# Patient Record
Sex: Female | Born: 1955 | Race: Black or African American | Hispanic: No | Marital: Married | Smoking: Never smoker
Health system: Southern US, Community
[De-identification: ages and names within clinical notes are randomized; demographics above are authoritative.]

## PROBLEM LIST (undated history)

## (undated) DIAGNOSIS — R519 Headache, unspecified: Secondary | ICD-10-CM

## (undated) DIAGNOSIS — R51 Headache: Secondary | ICD-10-CM

## (undated) DIAGNOSIS — R413 Other amnesia: Secondary | ICD-10-CM

## (undated) DIAGNOSIS — K219 Gastro-esophageal reflux disease without esophagitis: Secondary | ICD-10-CM

## (undated) HISTORY — DX: Gastro-esophageal reflux disease without esophagitis: K21.9

## (undated) HISTORY — DX: Headache: R51

## (undated) HISTORY — PX: ABDOMINAL HYSTERECTOMY: SHX81

## (undated) HISTORY — DX: Other amnesia: R41.3

## (undated) HISTORY — DX: Headache, unspecified: R51.9

---

## 2012-08-10 ENCOUNTER — Ambulatory Visit: Payer: Self-pay | Admitting: Family Medicine

## 2012-10-20 ENCOUNTER — Ambulatory Visit: Payer: Self-pay | Admitting: Family Medicine

## 2012-12-18 ENCOUNTER — Other Ambulatory Visit: Payer: Self-pay | Admitting: *Deleted

## 2013-10-21 ENCOUNTER — Other Ambulatory Visit: Payer: Self-pay

## 2013-10-21 DIAGNOSIS — Z1231 Encounter for screening mammogram for malignant neoplasm of breast: Secondary | ICD-10-CM

## 2013-10-29 ENCOUNTER — Ambulatory Visit
Admission: RE | Admit: 2013-10-29 | Discharge: 2013-10-29 | Disposition: A | Discharge status: Home / Self Care | Payer: Self-pay | Source: Ambulatory Visit

## 2013-10-29 ENCOUNTER — Ambulatory Visit: Payer: Self-pay

## 2013-10-29 DIAGNOSIS — Z1231 Encounter for screening mammogram for malignant neoplasm of breast: Secondary | ICD-10-CM

## 2013-11-10 ENCOUNTER — Ambulatory Visit: Payer: Self-pay

## 2014-06-16 ENCOUNTER — Other Ambulatory Visit: Payer: Self-pay

## 2014-06-16 ENCOUNTER — Encounter: Payer: Self-pay | Admitting: Gastroenterology

## 2014-06-16 ENCOUNTER — Encounter (INDEPENDENT_AMBULATORY_CARE_PROVIDER_SITE_OTHER): Payer: Self-pay

## 2014-06-16 ENCOUNTER — Ambulatory Visit (INDEPENDENT_AMBULATORY_CARE_PROVIDER_SITE_OTHER): Payer: BC Managed Care – PPO | Admitting: Gastroenterology

## 2014-06-16 ENCOUNTER — Encounter (HOSPITAL_COMMUNITY): Payer: Self-pay | Admitting: Pharmacy Technician

## 2014-06-16 VITALS — BP 148/95 | HR 59 | Temp 97.8°F | Ht 59.0 in | Wt 153.2 lb

## 2014-06-16 DIAGNOSIS — K3189 Other diseases of stomach and duodenum: Secondary | ICD-10-CM

## 2014-06-16 DIAGNOSIS — R1310 Dysphagia, unspecified: Secondary | ICD-10-CM | POA: Insufficient documentation

## 2014-06-16 DIAGNOSIS — R5383 Other fatigue: Principal | ICD-10-CM

## 2014-06-16 DIAGNOSIS — R5381 Other malaise: Secondary | ICD-10-CM

## 2014-06-16 DIAGNOSIS — R1013 Epigastric pain: Secondary | ICD-10-CM | POA: Insufficient documentation

## 2014-06-16 MED ORDER — ESOMEPRAZOLE MAGNESIUM 40 MG PO CPDR: DELAYED_RELEASE_CAPSULE | ORAL | Status: DC

## 2014-06-16 NOTE — Addendum Note (Signed)
Addended by: Jennings Books on: 06/16/2014 10:46 AM   Modules accepted: Orders

## 2014-06-16 NOTE — Progress Notes (Signed)
   Subjective:    Patient ID: Misty Stanley, female    DOB: March 23, 1956, 58 y.o.   MRN: [id number]  Milinda Antis, MD  HPI Dx: REFLUX-OFF AND ON FOR PAST YEAR BUT OVER LAST 6-8 MOS AGO. GOT WORSE. FELT NAUSEATED, BUT NO VOMITING. WAS USING NEXIUM 1X/DAY. STILL TAKING IT FROM TIME TO TIME. WAS DRINKING 1-2 CUPS OF COFFEE/DAY(EEVRY DAY) AND NOW NONE. SX ARE BETTER.  NO CIGS OR ETOH. TRIGGERS: SALADS, EATING LATE. MAY HAVE PROBLEMS WITH BIG PILLS BUT NOT WITH FOOD. HAD TCS AGE 63 IN [place], [place]?). BMs: DAILY. COUPLE MOS BACK HAD SOB BUT NOT LATELY. RARE: IBUPROFEN. NO ASPIRIN, BC/GOODY POWDERS, OR NAPROXEN/ALEVE.  PT DENIES FEVER, CHILLS, HEMATOCHEZIA, HEMATEMESIS, vomiting, melena, diarrhea, CHEST PAIN, SHORTNESS OF BREATH,  CHANGE IN BOWEL IN HABITS, constipation, abdominal pain, PROBLEMS WITH SEDATIONOR problems swallowing.  Past Medical History  Diagnosis Date  . GERD (gastroesophageal reflux disease)     Past Surgical History  Procedure Laterality Date  . Abdominal hysterectomy      No Known Allergies  Current Outpatient Prescriptions  Medication Sig Dispense Refill  . estradiol (VIVELLE-DOT) 0.1 MG/24HR patch        Family History  Problem Relation Age of Onset  . Colon cancer Maternal Aunt   . Colon polyps Neg Hx     History  Substance Use Topics  . Smoking status: Never Smoker   . Smokeless tobacco: Not on file  . Alcohol Use: No   Review of Systems PER HPI OTHERWISE ALL SYSTEMS ARE NEGATIVE.     Objective:   Physical Exam  Vitals reviewed. Constitutional: She is oriented to person, place, and time. She appears well-developed and well-nourished. No distress.  HENT:  Head: Normocephalic and atraumatic.  Mouth/Throat: Oropharynx is clear and moist. No oropharyngeal exudate.  Eyes: Pupils are equal, round, and reactive to light. No scleral icterus.  Neck: Normal range of motion. Neck supple.  Cardiovascular: Normal rate, regular rhythm and normal  heart sounds.   Pulmonary/Chest: Effort normal and breath sounds normal. No respiratory distress.  Abdominal: Soft. Bowel sounds are normal. She exhibits no distension. There is tenderness. There is no rebound and no guarding.  MILD TTP IN THE EPIGASTRIUM   Musculoskeletal: She exhibits no edema.  Lymphadenopathy:    She has no cervical adenopathy.  Neurological: She is alert and oriented to person, place, and time.  NO FOCAL DEFICITS   Psychiatric: She has a normal mood and affect.          Assessment & Plan:

## 2014-06-16 NOTE — Progress Notes (Signed)
Referral has been made to [facility] in [place]

## 2014-06-16 NOTE — Assessment & Plan Note (Signed)
Most likely due to stricture.  Egd/dil OCT 1 OPV IN JAN 2016

## 2014-06-16 NOTE — Progress Notes (Signed)
cc'ed to pcp °

## 2014-06-16 NOTE — Assessment & Plan Note (Signed)
SX MOST LIKELY DUE TO UNCONTROLLED REFLUX,LESS LIKELY H PYLORI GASTRITIS OR PUD.  CONTINUE YOUR WEIGHT LOSS EFFORTS. FOLLOW A HIGH FIBER/LOW FAT DIET. SEE INFO BELOW. CONTINUE NEXIUM. TAKE 30 MINUTES BEFORE FIRST MEAL. AVOID TRIGGERS FOR REFLUX. HO GIVEN. CONTINUE YOUR EXERCISE PLAN: WALK 30 MINS THREE TIMES A WEEK. OPV JAN 2016

## 2014-06-16 NOTE — Patient Instructions (Signed)
CONTINUE YOUR WEIGHT LOSS EFFORTS. LOSE 10 LBS.  FOLLOW A HIGH FIBER/LOW FAT DIET. SEE INFO BELOW ON A LOW FAT DIET..  CONTINUE NEXIUM. TAKE 30 MINUTES BEFORE FIRST MEAL.  AVOID TRIGGERS FOR REFLUX. SEE INFO BELOW.  CONTINUE YOUR EXERCISE PLAN: WALK 30 MINS THREE TIMES A WEEK.  FOLLOW UP IN  JAN 2016.   Lifestyle and home remedies TO MANAGE REFLUX/CHEST PAIN  You may eliminate or reduce the frequency of heartburn by making the following lifestyle changes:    Control your weight. Being overweight is a major risk factor for heartburn and GERD. Excess pounds put pressure on your abdomen, pushing up your stomach and causing acid to back up into your esophagus.     Eat smaller meals. 4 TO 6 MEALS A DAY. This reduces pressure on the lower esophageal sphincter, helping to prevent the valve from opening and acid from washing back into your esophagus.     Loosen your belt. Clothes that fit tightly around your waist put pressure on your abdomen and the lower esophageal sphincter.     Eliminate heartburn triggers. Everyone has specific triggers. Common triggers such as fatty or fried foods, spicy food, tomato sauce, carbonated beverages, alcohol, chocolate, mint, garlic, onion, caffeine and nicotine may make heartburn worse.     Avoid stooping or bending. Tying your shoes is OK. Bending over for longer periods to weed your garden isn't, especially soon after eating.     Don't lie down after a meal. Wait at least three to four hours after eating before going to bed, and don't lie down right after eating.     PUT THE HEAD OF YOUR BED ON 6 INCH BLOCKS.   Alternative medicine   Several home remedies exist for treating GERD, but they provide only temporary relief. They include drinking baking soda (sodium bicarbonate) added to water or drinking other fluids such as baking soda mixed with cream of tartar and water.    Although these liquids create temporary relief by neutralizing, washing away or  buffering acids, eventually they aggravate the situation by adding gas and fluid to your stomach, increasing pressure and causing more acid reflux. Further, adding more sodium to your diet may increase your blood pressure and add stress to your heart, and excessive bicarbonate ingestion can alter the acid-base balance in your body.      Low-Fat Diet BREADS, CEREALS, PASTA, RICE, DRIED PEAS, AND BEANS These products are high in carbohydrates and most are low in fat. Therefore, they can be increased in the diet as substitutes for fatty foods. They too, however, contain calories and should not be eaten in excess. Cereals can be eaten for snacks as well as for breakfast.   FRUITS AND VEGETABLES It is good to eat fruits and vegetables. Besides being sources of fiber, both are rich in vitamins and some minerals. They help you get the daily allowances of these nutrients. Fruits and vegetables can be used for snacks and desserts.  MEATS Limit lean meat, chicken, [place], and fish to no more than 6 ounces per day. Beef, Pork, and Lamb Use lean cuts of beef, pork, and lamb. Lean cuts include:  Extra-lean ground beef.  Arm roast.  Sirloin tip.  Center-cut ham.  Round steak.  Loin chops.  Rump roast.  Tenderloin.  Trim all fat off the outside of meats before cooking. It is not necessary to severely decrease the intake of red meat, but lean choices should be made. Lean meat is rich in  protein and contains a highly absorbable form of iron. Premenopausal women, in particular, should avoid reducing lean red meat because this could increase the risk for low red blood cells (iron-deficiency anemia).  Chicken and [place] These are good sources of protein. The fat of poultry can be reduced by removing the skin and underlying fat layers before cooking. Chicken and [place] can be substituted for lean red meat in the diet. Poultry should not be fried or covered with high-fat sauces. Fish and Shellfish Fish is a  good source of protein. Shellfish contain cholesterol, but they usually are low in saturated fatty acids. The preparation of fish is important. Like chicken and [place], they should not be fried or covered with high-fat sauces. EGGS Egg whites contain no fat or cholesterol. They can be eaten often. Try 1 to 2 egg whites instead of whole eggs in recipes or use egg substitutes that do not contain yolk. MILK AND DAIRY PRODUCTS Use skim or 1% milk instead of 2% or whole milk. Decrease whole milk, natural, and processed cheeses. Use nonfat or low-fat (2%) cottage cheese or low-fat cheeses made from vegetable oils. Choose nonfat or low-fat (1 to 2%) yogurt. Experiment with evaporated skim milk in recipes that call for heavy cream. Substitute low-fat yogurt or low-fat cottage cheese for sour cream in dips and salad dressings. Have at least 2 servings of low-fat dairy products, such as 2 glasses of skim (or 1%) milk each day to help get your daily calcium intake. FATS AND OILS Reduce the total intake of fats, especially saturated fat. Butterfat, lard, and beef fats are high in saturated fat and cholesterol. These should be avoided as much as possible. Vegetable fats do not contain cholesterol, but certain vegetable fats, such as coconut oil, palm oil, and palm kernel oil are very high in saturated fats. These should be limited. These fats are often used in bakery goods, processed foods, popcorn, oils, and nondairy creamers. Vegetable shortenings and some peanut butters contain hydrogenated oils, which are also saturated fats. Read the labels on these foods and check for saturated vegetable oils. Unsaturated vegetable oils and fats do not raise blood cholesterol. However, they should be limited because they are fats and are high in calories. Total fat should still be limited to 30% of your daily caloric intake. Desirable liquid vegetable oils are corn oil, cottonseed oil, olive oil, canola oil, safflower oil, soybean  oil, and sunflower oil. Peanut oil is not as good, but small amounts are acceptable. Buy a heart-healthy tub margarine that has no partially hydrogenated oils in the ingredients. Mayonnaise and salad dressings often are made from unsaturated fats, but they should also be limited because of their high calorie and fat content. Seeds, nuts, peanut butter, olives, and avocados are high in fat, but the fat is mainly the unsaturated type. These foods should be limited mainly to avoid excess calories and fat. OTHER EATING TIPS Snacks  Most sweets should be limited as snacks. They tend to be rich in calories and fats, and their caloric content outweighs their nutritional value. Some good choices in snacks are graham crackers, melba toast, soda crackers, bagels (no egg), English muffins, fruits, and vegetables. These snacks are preferable to snack crackers, [place] fries, TORTILLA CHIPS, and POTATO chips. Popcorn should be air-popped or cooked in small amounts of liquid vegetable oil. Desserts Eat fruit, low-fat yogurt, and fruit ices instead of pastries, cake, and cookies. Sherbet, angel food cake, gelatin dessert, frozen low-fat yogurt, or other frozen  products that do not contain saturated fat (pure fruit juice bars, frozen ice pops) are also acceptable.  COOKING METHODS Choose those methods that use little or no fat. They include: Poaching.  Braising.  Steaming.  Grilling.  Baking.  Stir-frying.  Broiling.  Microwaving.  Foods can be cooked in a nonstick pan without added fat, or use a nonfat cooking spray in regular cookware. Limit fried foods and avoid frying in saturated fat. Add moisture to lean meats by using water, broth, cooking wines, and other nonfat or low-fat sauces along with the cooking methods mentioned above. Soups and stews should be chilled after cooking. The fat that forms on top after a few hours in the refrigerator should be skimmed off. When preparing meals, avoid using excess salt.  Salt can contribute to raising blood pressure in some people.  EATING AWAY FROM HOME Order entres, potatoes, and vegetables without sauces or butter. When meat exceeds the size of a deck of cards (3 to 4 ounces), the rest can be taken home for another meal. Choose vegetable or fruit salads and ask for low-calorie salad dressings to be served on the side. Use dressings sparingly. Limit high-fat toppings, such as bacon, crumbled eggs, cheese, sunflower seeds, and olives. Ask for heart-healthy tub margarine instead of butter.

## 2014-06-16 NOTE — Progress Notes (Signed)
PATIENT NIC'D  °

## 2014-06-23 ENCOUNTER — Encounter (HOSPITAL_COMMUNITY): Payer: Self-pay | Admitting: *Deleted

## 2014-06-23 ENCOUNTER — Encounter (HOSPITAL_COMMUNITY)
Admission: RE | Disposition: A | Discharge status: Home / Self Care | Payer: Self-pay | Source: Ambulatory Visit | Attending: Gastroenterology

## 2014-06-23 ENCOUNTER — Ambulatory Visit (HOSPITAL_COMMUNITY)
Admission: RE | Admit: 2014-06-23 | Discharge: 2014-06-23 | Disposition: A | Discharge status: Home / Self Care | Payer: BC Managed Care – PPO | Source: Ambulatory Visit | Attending: Gastroenterology | Admitting: Gastroenterology

## 2014-06-23 DIAGNOSIS — K222 Esophageal obstruction: Secondary | ICD-10-CM | POA: Insufficient documentation

## 2014-06-23 DIAGNOSIS — Z90710 Acquired absence of both cervix and uterus: Secondary | ICD-10-CM | POA: Diagnosis not present

## 2014-06-23 DIAGNOSIS — K219 Gastro-esophageal reflux disease without esophagitis: Secondary | ICD-10-CM | POA: Insufficient documentation

## 2014-06-23 DIAGNOSIS — Z79890 Hormone replacement therapy (postmenopausal): Secondary | ICD-10-CM | POA: Diagnosis not present

## 2014-06-23 DIAGNOSIS — Z79899 Other long term (current) drug therapy: Secondary | ICD-10-CM | POA: Insufficient documentation

## 2014-06-23 DIAGNOSIS — K2970 Gastritis, unspecified, without bleeding: Secondary | ICD-10-CM | POA: Insufficient documentation

## 2014-06-23 DIAGNOSIS — R1310 Dysphagia, unspecified: Secondary | ICD-10-CM | POA: Diagnosis present

## 2014-06-23 DIAGNOSIS — R1013 Epigastric pain: Secondary | ICD-10-CM

## 2014-06-23 HISTORY — PX: ESOPHAGOGASTRODUODENOSCOPY: SHX5428

## 2014-06-23 HISTORY — PX: SAVORY DILATION: SHX5439

## 2014-06-23 SURGERY — EGD (ESOPHAGOGASTRODUODENOSCOPY)
Anesthesia: Moderate Sedation

## 2014-06-23 MED ORDER — LIDOCAINE VISCOUS 2 % MT SOLN
OROMUCOSAL | Status: DC | PRN
  Administered 2014-06-23: 3 mL via OROMUCOSAL

## 2014-06-23 MED ORDER — MEPERIDINE HCL 100 MG/ML IJ SOLN
INTRAMUSCULAR | Status: AC
  Filled 2014-06-23: qty 2

## 2014-06-23 MED ORDER — STERILE WATER FOR IRRIGATION IR SOLN
Status: DC | PRN
  Administered 2014-06-23: 10:00:00

## 2014-06-23 MED ORDER — SODIUM CHLORIDE 0.9 % IV SOLN
INTRAVENOUS | Status: DC
  Administered 2014-06-23: 09:00:00 via INTRAVENOUS

## 2014-06-23 MED ORDER — MIDAZOLAM HCL 5 MG/5ML IJ SOLN
INTRAMUSCULAR | Status: DC | PRN
  Administered 2014-06-23: 2 mg via INTRAVENOUS
  Administered 2014-06-23: 1 mg via INTRAVENOUS
  Administered 2014-06-23: 2 mg via INTRAVENOUS

## 2014-06-23 MED ORDER — MEPERIDINE HCL 100 MG/ML IJ SOLN
INTRAMUSCULAR | Status: DC | PRN
  Administered 2014-06-23 (×3): 25 mg via INTRAVENOUS

## 2014-06-23 MED ORDER — MIDAZOLAM HCL 5 MG/5ML IJ SOLN
INTRAMUSCULAR | Status: DC
Start: 2014-06-23 — End: 2014-06-23
  Filled 2014-06-23: qty 10

## 2014-06-23 MED ORDER — LIDOCAINE VISCOUS 2 % MT SOLN
OROMUCOSAL | Status: AC
  Filled 2014-06-23: qty 15

## 2014-06-23 MED ORDER — MINERAL OIL PO OIL
TOPICAL_OIL | ORAL | Status: AC
  Filled 2014-06-23: qty 30

## 2014-06-23 NOTE — Interval H&P Note (Signed)
History and Physical Interval Note:  06/23/2014 10:10 AM  Misty Stanley  has presented today for surgery, with the diagnosis of DYSPHAGIA, DYSPEPSIA  The various methods of treatment have been discussed with the patient and family. After consideration of risks, benefits and other options for treatment, the patient has consented to  Procedure(s) with comments: ESOPHAGOGASTRODUODENOSCOPY (EGD) (N/A) - 10:15 MALONEY DILATION (N/A) SAVORY DILATION (N/A) as a surgical intervention .  The patient's history has been reviewed, patient examined, no change in status, stable for surgery.  I have reviewed the patient's chart and labs.  Questions were answered to the patient's satisfaction.     Eaton CorporationSandi Jillayne Witte

## 2014-06-23 NOTE — Op Note (Signed)
[facility] [address] [place], [id number]   ENDOSCOPY PROCEDURE REPORT  PATIENT: Misty Stanley, Misty Stanley  MR#: [id number] BIRTHDATE: 25-Jul-1956 , 58  yrs. old GENDER: female  ENDOSCOPIST: Jonette EvaSandi Fields, MD REFFERED XB:[id number]:Kawanta Traver, M.D.  PROCEDURE DATE:  06/23/2014 PROCEDURE:   EGD with biopsy and EGD with dilatation over guidewire   INDICATIONS:1.  dyspepsia.   2.  dysphagia. MEDICATIONS: Demerol 75 mg IV and Versed 5 mg IV TOPICAL ANESTHETIC: Viscous Xylocaine  DESCRIPTION OF PROCEDURE:   After the risks benefits and alternatives of the procedure were thoroughly explained, informed consent was obtained.  The [id number] ([id number])  endoscope was introduced through the mouth and advanced to the second portion of the duodenum. The instrument was slowly withdrawn as the mucosa was carefully examined.  Prior to withdrawal of the scope, the guidwire was placed.  The esophagus was dilated successfully.  The patient was recovered in endoscopy and discharged home in satisfactory condition.   ESOPHAGUS: A Schatzki ring was found at the gastroesophageal junction.   STOMACH: Mild non-erosive gastritis (inflammation) was found in the gastric antrum.  Multiple biopsies were performed using cold forceps.   Dilation was then performed at the gastroesphageal junction Dilator: Savary over guidewire Size(s): 14-16 MM Heme: none  COMPLICATIONS: There were no immediate complications.  ENDOSCOPIC IMPRESSION: 1.   Schatzki ring at the gastroesophageal junction 2.   MILD Non-erosive gastritis  RECOMMENDATIONS: CONTINUE YOUR WEIGHT LOSS EFFORTS.  LOSE 10 LBS. FOLLOW A HIGH FIBER/LOW FAT DIET.  SEE INFO BELOW ON A LOW FAT DIET. CONTINUE NEXIUM.  TAKE 30 MINUTES BEFORE FIRST MEAL. AVOID TRIGGERS FOR REFLUX. CONTINUE YOUR EXERCISE PLAN: WALK 30 MINS THREE TIMES A WEEK. FOLLOW UP IN JAN 2016.  _______________________________ eSignedJonette Eva:  Sandi Fields, MD 06/23/2014 10:54 AM   CPT  CODES: ICD CODES:  The ICD and CPT codes recommended by this software are interpretations from the data that the clinical staff has captured with the software.  The verification of the translation of this report to the ICD and CPT codes and modifiers is the sole responsibility of the health care institution and practicing physician where this report was generated.  [employer] will not be held responsible for the validity of the ICD and CPT codes included on this report.  AMA assumes no liability for data contained or not contained herein. CPT is a Publishing rights managerregistered trademark of the [employer].

## 2014-06-23 NOTE — H&P (View-Only) (Signed)
   Subjective:    Patient ID: Georgiann HahnVicky J Dilling, female    DOB: September 29, 1955, 58 y.o.   MRN: [id number]  Milinda AntisURHAM, KAWANTA, MD  HPI Dx: REFLUX-OFF AND ON FOR PAST YEAR BUT OVER LAST 6-8 MOS AGO. GOT WORSE. FELT NAUSEATED, BUT NO VOMITING. WAS USING NEXIUM 1X/DAY. STILL TAKING IT FROM TIME TO TIME. WAS DRINKING 1-2 CUPS OF COFFEE/DAY(EEVRY DAY) AND NOW NONE. SX ARE BETTER.  NO CIGS OR ETOH. TRIGGERS: SALADS, EATING LATE. MAY HAVE PROBLEMS WITH BIG PILLS BUT NOT WITH FOOD. HAD TCS AGE 70 IN [place], [place] BMs: DAILY. COUPLE MOS BACK HAD SOB BUT NOT LATELY. RARE: IBUPROFEN. NO ASPIRIN, BC/GOODY POWDERS, OR NAPROXEN/ALEVE.  PT DENIES FEVER, CHILLS, HEMATOCHEZIA, HEMATEMESIS, vomiting, melena, diarrhea, CHEST PAIN, SHORTNESS OF BREATH,  CHANGE IN BOWEL IN HABITS, constipation, abdominal pain, PROBLEMS WITH SEDATIONOR problems swallowing.  Past Medical History  Diagnosis Date  . GERD (gastroesophageal reflux disease)     Past Surgical History  Procedure Laterality Date  . Abdominal hysterectomy      No Known Allergies  Current Outpatient Prescriptions  Medication Sig Dispense Refill  . estradiol (VIVELLE-DOT) 0.1 MG/24HR patch        Family History  Problem Relation Age of Onset  . Colon cancer Maternal Aunt   . Colon polyps Neg Hx     History  Substance Use Topics  . Smoking status: Never Smoker   . Smokeless tobacco: Not on file  . Alcohol Use: No   Review of Systems PER HPI OTHERWISE ALL SYSTEMS ARE NEGATIVE.     Objective:   Physical Exam  Vitals reviewed. Constitutional: She is oriented to person, place, and time. She appears well-developed and well-nourished. No distress.  HENT:  Head: Normocephalic and atraumatic.  Mouth/Throat: Oropharynx is clear and moist. No oropharyngeal exudate.  Eyes: Pupils are equal, round, and reactive to light. No scleral icterus.  Neck: Normal range of motion. Neck supple.  Cardiovascular: Normal rate, regular rhythm and normal  heart sounds.   Pulmonary/Chest: Effort normal and breath sounds normal. No respiratory distress.  Abdominal: Soft. Bowel sounds are normal. She exhibits no distension. There is tenderness. There is no rebound and no guarding.  MILD TTP IN THE EPIGASTRIUM   Musculoskeletal: She exhibits no edema.  Lymphadenopathy:    She has no cervical adenopathy.  Neurological: She is alert and oriented to person, place, and time.  NO FOCAL DEFICITS   Psychiatric: She has a normal mood and affect.          Assessment & Plan:

## 2014-06-23 NOTE — Discharge Instructions (Signed)
I dilated your esophagus. You had a ring near the base of your esophagus. You have A HIATAL HERNIA AND gastritis. I biopsied your stomach.   CONTINUE YOUR WEIGHT LOSS EFFORTS. LOSE 10 LBS.   FOLLOW A HIGH FIBER/LOW FAT DIET.   CONTINUE NEXIUM. TAKE 30 MINUTES BEFORE FIRST MEAL.   AVOID TRIGGERS FOR REFLUX.    CONTINUE YOUR EXERCISE PLAN: WALK 30 MINS THREE TIMES A WEEK.   FOLLOW UP IN JAN 2016.  UPPER ENDOSCOPY AFTER CARE Read the instructions outlined below and refer to this sheet in the next week. These discharge instructions provide you with general information on caring for yourself after you leave the hospital. While your treatment has been planned according to the most current medical practices available, unavoidable complications occasionally occur. If you have any problems or questions after discharge, call DR. Secilia Apps, [phone].  ACTIVITY  You may resume your regular activity, but move at a slower pace for the next 24 hours.   Take frequent rest periods for the next 24 hours.   Walking will help get rid of the air and reduce the bloated feeling in your belly (abdomen).   No driving for 24 hours (because of the medicine (anesthesia) used during the test).   You may shower.   Do not sign any important legal documents or operate any machinery for 24 hours (because of the anesthesia used during the test).    NUTRITION  Drink plenty of fluids.   You may resume your normal diet as instructed by your doctor.   Begin with a light meal and progress to your normal diet. Heavy or fried foods are harder to digest and may make you feel sick to your stomach (nauseated).   Avoid alcoholic beverages for 24 hours or as instructed.    MEDICATIONS  You may resume your normal medications.   WHAT YOU CAN EXPECT TODAY  Some feelings of bloating in the abdomen.   Passage of more gas than usual.    IF YOU HAD A BIOPSY TAKEN DURING THE UPPER ENDOSCOPY:  Eat a soft diet  IF YOU HAVE NAUSEA, BLOATING, ABDOMINAL PAIN, OR VOMITING.    FINDING OUT THE RESULTS OF YOUR TEST Not all test results are available during your visit. DR. Darrick Penna WILL CALL YOU WITHIN 7 DAYS OF YOUR PROCEDUE WITH YOUR RESULTS. Do not assume everything is normal if you have not heard from DR. Geneveive Furness IN ONE WEEK, CALL HER OFFICE AT [phone].  SEEK IMMEDIATE MEDICAL ATTENTION AND CALL THE OFFICE: [phone] IF:  You have more than a spotting of blood in your stool.   Your belly is swollen (abdominal distention).   You are nauseated or vomiting.   You have a temperature over 101F.   You have abdominal pain or discomfort that is severe or gets worse throughout the day.  Gastritis  Gastritis is an inflammation (the body's way of reacting to injury and/or infection) of the stomach. It is often caused by viral or bacterial (germ) infections. It can also be caused BY ASPIRIN, BC/GOODY POWDER'S, (IBUPROFEN) MOTRIN, OR ALEVE (NAPROXEN), chemicals (including alcohol), SPICY FOODS, and medications. This illness may be associated with generalized malaise (feeling tired, not well), UPPER ABDOMINAL STOMACH cramps, and fever. One common bacterial cause of gastritis is an organism known as H. Pylori. This can be treated with antibiotics.   Hiatal Hernia A hiatal hernia occurs when a part of the stomach slides above the diaphragm. The diaphragm is the thin muscle separating the belly (  abdomen) from the chest. A hiatal hernia can be something you are born with or develop over time. Hiatal hernias may allow stomach acid to flow back into your esophagus, the tube which carries food from your mouth to your stomach. If this acid causes problems it is called GERD (gastro-esophageal reflux disease).   SYMPTOMS Common symptoms of GERD are heartburn (burning in your chest). This is worse when lying down or bending over. It may also cause belching and indigestion. Some of the things which make GERD worse  are:  Increased weight pushes on stomach making acid rise more easily.   Alcohol decreases lower esophageal sphincter pressure (valve between stomach and esophagus), allowing acid from stomach into esophagus.   Late evening meals and going to bed with a full stomach increases pressure.    HOME CARE INSTRUCTIONS  Try to achieve and maintain an ideal body weight.   Avoid drinking alcoholic beverages.   DO NOT smokE.   Do not wear tight clothing around your chest or stomach.   Eat smaller meals and eat more frequently. This keeps your stomach from getting too full. Eat slowly.   Do not lie down for 2 or 3 hours after eating. Do not eat or drink anything 1 to 2 hours before going to bed.   Avoid caffeine beverages (colas, coffee, cocoa, tea), fatty foods, citrus fruits and all other foods and drinks that contain acid and that seem to increase the problems.   Avoid bending over, especially after eating OR STRAINING. Anything that increases the pressure in your belly increases the amount of acid that may be pushed up into your esophagus.    ESOPHAGEAL RING Esophageal RING can be caused by stomach acid backing up into the tube that carries food from the mouth down to the stomach (lower esophagus).  TREATMENT There are a number of medicines used to treat reflux/stricture, including: Antacids.  NEXIUM  HOME CARE INSTRUCTIONS Eat 2-3 hours before going to bed.  Try to reach and maintain a healthy weight.  Do not eat just a few very large meals. Instead, eat 4 TO 6 smaller meals throughout the day.  Try to identify foods and beverages that make your symptoms worse, and avoid these.  Avoid tight clothing.  Do not exercise right after eating.

## 2014-06-27 ENCOUNTER — Encounter (HOSPITAL_COMMUNITY): Payer: Self-pay | Admitting: Gastroenterology

## 2014-07-18 ENCOUNTER — Telehealth: Payer: Self-pay | Admitting: Gastroenterology

## 2014-07-18 NOTE — Telephone Encounter (Signed)
Reminder in epic °

## 2014-07-18 NOTE — Telephone Encounter (Signed)
LMOM to call.

## 2014-07-18 NOTE — Telephone Encounter (Signed)
Please call pt. HER stomach Bx shows mild gastritis.   CONTINUE YOUR WEIGHT LOSS EFFORTS. LOSE 10 LBS.   FOLLOW A HIGH FIBER/LOW FAT DIET.  CONTINUE NEXIUM. TAKE 30 MINUTES BEFORE FIRST MEAL.   AVOID TRIGGERS FOR REFLUX.    CONTINUE YOUR EXERCISE PLAN: WALK 30 MINS THREE TIMES A WEEK.   FOLLOW UP IN JAN 2016.

## 2014-07-18 NOTE — Telephone Encounter (Signed)
Pt returned call and was informed.  

## 2014-09-19 ENCOUNTER — Encounter: Payer: Self-pay | Admitting: Gastroenterology

## 2014-11-16 ENCOUNTER — Ambulatory Visit: Payer: Self-pay | Admitting: Gastroenterology

## 2014-12-14 ENCOUNTER — Ambulatory Visit: Payer: Self-pay | Admitting: Gastroenterology

## 2015-01-12 ENCOUNTER — Ambulatory Visit (INDEPENDENT_AMBULATORY_CARE_PROVIDER_SITE_OTHER): Payer: BLUE CROSS/BLUE SHIELD | Admitting: Gastroenterology

## 2015-01-12 ENCOUNTER — Encounter: Payer: Self-pay | Admitting: Gastroenterology

## 2015-01-12 VITALS — BP 147/87 | HR 68 | Temp 97.6°F | Ht 60.0 in | Wt 160.0 lb

## 2015-01-12 DIAGNOSIS — R1013 Epigastric pain: Secondary | ICD-10-CM

## 2015-01-12 DIAGNOSIS — R1310 Dysphagia, unspecified: Secondary | ICD-10-CM | POA: Diagnosis not present

## 2015-01-12 NOTE — Patient Instructions (Signed)
CONTINUE YOUR WEIGHT LOSS EFFORTS.  FOLLOW A LOW FAT DIET. SEE INFO BELOW.  AVOID FOOD THAT TRIGGERS REFLUX. SEE INFO BELOW.   CONTINUE NEXIUM. TAKE 30 MINUTES BEFORE YOUR FIRST MEAL.  FOLLOW UP IN 3 MOS.   Low-Fat Diet BREADS, CEREALS, PASTA, RICE, DRIED PEAS, AND BEANS These products are high in carbohydrates and most are low in fat. Therefore, they can be increased in the diet as substitutes for fatty foods. They too, however, contain calories and should not be eaten in excess. Cereals can be eaten for snacks as well as for breakfast.   FRUITS AND VEGETABLES It is good to eat fruits and vegetables. Besides being sources of fiber, both are rich in vitamins and some minerals. They help you get the daily allowances of these nutrients. Fruits and vegetables can be used for snacks and desserts.  MEATS Limit lean meat, chicken, [place], and fish to no more than 6 ounces per day. Beef, Pork, and Lamb Use lean cuts of beef, pork, and lamb. Lean cuts include:  Extra-lean ground beef.  Arm roast.  Sirloin tip.  Center-cut ham.  Round steak.  Loin chops.  Rump roast.  Tenderloin.  Trim all fat off the outside of meats before cooking. It is not necessary to severely decrease the intake of red meat, but lean choices should be made. Lean meat is rich in protein and contains a highly absorbable form of iron. Premenopausal women, in particular, should avoid reducing lean red meat because this could increase the risk for low red blood cells (iron-deficiency anemia).  Chicken and [place] These are good sources of protein. The fat of poultry can be reduced by removing the skin and underlying fat layers before cooking. Chicken and [place] can be substituted for lean red meat in the diet. Poultry should not be fried or covered with high-fat sauces. Fish and Shellfish Fish is a good source of protein. Shellfish contain cholesterol, but they usually are low in saturated fatty acids. The preparation of  fish is important. Like chicken and [place], they should not be fried or covered with high-fat sauces. EGGS Egg whites contain no fat or cholesterol. They can be eaten often. Try 1 to 2 egg whites instead of whole eggs in recipes or use egg substitutes that do not contain yolk. MILK AND DAIRY PRODUCTS Use skim or 1% milk instead of 2% or whole milk. Decrease whole milk, natural, and processed cheeses. Use nonfat or low-fat (2%) cottage cheese or low-fat cheeses made from vegetable oils. Choose nonfat or low-fat (1 to 2%) yogurt. Experiment with evaporated skim milk in recipes that call for heavy cream. Substitute low-fat yogurt or low-fat cottage cheese for sour cream in dips and salad dressings. Have at least 2 servings of low-fat dairy products, such as 2 glasses of skim (or 1%) milk each day to help get your daily calcium intake. FATS AND OILS Reduce the total intake of fats, especially saturated fat. Butterfat, lard, and beef fats are high in saturated fat and cholesterol. These should be avoided as much as possible. Vegetable fats do not contain cholesterol, but certain vegetable fats, such as coconut oil, palm oil, and palm kernel oil are very high in saturated fats. These should be limited. These fats are often used in bakery goods, processed foods, popcorn, oils, and nondairy creamers. Vegetable shortenings and some peanut butters contain hydrogenated oils, which are also saturated fats. Read the labels on these foods and check for saturated vegetable oils. Unsaturated vegetable oils and  fats do not raise blood cholesterol. However, they should be limited because they are fats and are high in calories. Total fat should still be limited to 30% of your daily caloric intake. Desirable liquid vegetable oils are corn oil, cottonseed oil, olive oil, canola oil, safflower oil, soybean oil, and sunflower oil. Peanut oil is not as good, but small amounts are acceptable. Buy a heart-healthy tub margarine that  has no partially hydrogenated oils in the ingredients. Mayonnaise and salad dressings often are made from unsaturated fats, but they should also be limited because of their high calorie and fat content. Seeds, nuts, peanut butter, olives, and avocados are high in fat, but the fat is mainly the unsaturated type. These foods should be limited mainly to avoid excess calories and fat. OTHER EATING TIPS Snacks  Most sweets should be limited as snacks. They tend to be rich in calories and fats, and their caloric content outweighs their nutritional value. Some good choices in snacks are graham crackers, melba toast, soda crackers, bagels (no egg), English muffins, fruits, and vegetables. These snacks are preferable to snack crackers, [place] fries, TORTILLA CHIPS, and POTATO chips. Popcorn should be air-popped or cooked in small amounts of liquid vegetable oil. Desserts Eat fruit, low-fat yogurt, and fruit ices instead of pastries, cake, and cookies. Sherbet, angel food cake, gelatin dessert, frozen low-fat yogurt, or other frozen products that do not contain saturated fat (pure fruit juice bars, frozen ice pops) are also acceptable.  COOKING METHODS Choose those methods that use little or no fat. They include: Poaching.  Braising.  Steaming.  Grilling.  Baking.  Stir-frying.  Broiling.  Microwaving.  Foods can be cooked in a nonstick pan without added fat, or use a nonfat cooking spray in regular cookware. Limit fried foods and avoid frying in saturated fat. Add moisture to lean meats by using water, broth, cooking wines, and other nonfat or low-fat sauces along with the cooking methods mentioned above. Soups and stews should be chilled after cooking. The fat that forms on top after a few hours in the refrigerator should be skimmed off. When preparing meals, avoid using excess salt. Salt can contribute to raising blood pressure in some people.  EATING AWAY FROM HOME Order entres, potatoes, and  vegetables without sauces or butter. When meat exceeds the size of a deck of cards (3 to 4 ounces), the rest can be taken home for another meal. Choose vegetable or fruit salads and ask for low-calorie salad dressings to be served on the side. Use dressings sparingly. Limit high-fat toppings, such as bacon, crumbled eggs, cheese, sunflower seeds, and olives. Ask for heart-healthy tub margarine instead of butter.   Lifestyle and home remedies TO MANAGE REFLUX  You may eliminate or reduce the frequency of heartburn by making the following lifestyle changes:  . Control your weight. Being overweight is a major risk factor for heartburn and GERD. Excess pounds put pressure on your abdomen, pushing up your stomach and causing acid to back up into your esophagus.   . Eat smaller meals. 4 TO 6 MEALS A DAY. This reduces pressure on the lower esophageal sphincter, helping to prevent the valve from opening and acid from washing back into your esophagus.   Allena Earing. Loosen your belt. Clothes that fit tightly around your waist put pressure on your abdomen and the lower esophageal sphincter.   . Eliminate heartburn triggers. Everyone has specific triggers. Common triggers such as fatty or fried foods, spicy food, tomato sauce, carbonated beverages,  alcohol, chocolate, mint, garlic, onion, caffeine and nicotine may make heartburn worse.   Marland Kitchen Avoid stooping or bending. Tying your shoes is OK. Bending over for longer periods to weed your garden isn't, especially soon after eating.   . Don't lie down after a meal. Wait at least three to four hours after eating before going to bed, and don't lie down right after eating.   Marland Kitchen PUT THE HEAD OF YOUR BED ON 6 INCH BLOCKS.   Alternative medicine . Several home remedies exist for treating GERD, but they provide only temporary relief. They include drinking baking soda (sodium bicarbonate) added to water or drinking other fluids such as baking soda mixed with cream of tartar  and water.  . Although these liquids create temporary relief by neutralizing, washing away or buffering acids, eventually they aggravate the situation by adding gas and fluid to your stomach, increasing pressure and causing more acid reflux. Further, adding more sodium to your diet may increase your blood pressure and add stress to your heart, and excessive bicarbonate ingestion can alter the acid-base balance in your body.

## 2015-01-12 NOTE — Assessment & Plan Note (Signed)
SYMPTOMS improved but NOT IDEALLY CONTROLLED.  CONTINUE YOUR WEIGHT LOSS EFFORTS. FOLLOW A LOW FAT DIET. SEE INFO BELOW. CONTINUE NEXIUM. TAKE 30 MINUTES BEFORE YOUR FIRST MEAL. FOLLOW UP IN 3 MOS.

## 2015-01-12 NOTE — Progress Notes (Signed)
   Subjective:    Patient ID: Misty HahnVicky J Stanley, female    DOB: 1956-07-23, 59 y.o.   MRN: [id number]  Misty Stanley,Misty DAVID, MD  HPI HAVING TROUBLE WITH R LOW BACK AND LEG PAIN. HEARTBURN: NONE IF SHE TAKES HER PILLS AND EATS RIGHT. ADMITS SHE DOESN'T EAT RIGHT. BMS: EVERY 2-3 X/WEEK. MAY HAVE ABD PAIN OFF AND ON-W/ RAISIN BRAN. May eat chocolate, caffeine, drink a coke & WATER. GAINED 7 LBS SINCE OCT 2015.   PT DENIES FEVER, CHILLS, HEMATOCHEZIA, nausea, vomiting, melena, diarrhea, CHEST PAIN, SHORTNESS OF BREATH,  CHANGE IN BOWEL IN HABITS, constipation, problems swallowing, problems with sedation, heartburn or indigestion.   Past Medical History  Diagnosis Date  . GERD (gastroesophageal reflux disease)    Past Surgical History  Procedure Laterality Date  . Abdominal hysterectomy    . Esophagogastroduodenoscopy N/A 06/23/2014    SLF: 1. Schatzki ring at the gastroesophageal junction 2. MILD non-erosive gastritis.  Gaspar Bidding. Savory dilation N/A 06/23/2014    Procedure: SAVORY DILATION;  Surgeon: West BaliSandi L Ramaj Frangos, MD;  Location: [facility];  Service: Endoscopy;  Laterality: N/A;   No Known Allergies  Current Outpatient Prescriptions  Medication Sig Dispense Refill  . esomeprazole (NEXIUM) 40 MG capsule 1 PO 30 MINUTES PRIOR TO FIRST MEAL    . estradiol (VIVELLE-DOT) 0.1 MG/24HR patch Place 1 patch onto the skin 2 (two) times a week.      Review of Systems     Objective:   Physical Exam  Constitutional: She is oriented to person, place, and time. She appears well-developed and well-nourished. No distress.  HENT:  Head: Normocephalic and atraumatic.  Mouth/Throat: Oropharynx is clear and moist. No oropharyngeal exudate.  Eyes: Pupils are equal, round, and reactive to light. No scleral icterus.  Neck: Normal range of motion. Neck supple.  Cardiovascular: Normal rate, regular rhythm and normal heart sounds.   Pulmonary/Chest: Effort normal and breath sounds normal. No respiratory distress.   Abdominal: Soft. Bowel sounds are normal. She exhibits no distension. There is no tenderness.  Musculoskeletal: She exhibits no edema.  Lymphadenopathy:    She has no cervical adenopathy.  Neurological: She is alert and oriented to person, place, and time.  Psychiatric: She has a normal mood and affect.  Vitals reviewed.         Assessment & Plan:

## 2015-01-12 NOTE — Progress Notes (Signed)
cc'ed to pcp °

## 2015-01-12 NOTE — Assessment & Plan Note (Signed)
SYMPTOMS CONTROLLED/RESOLVED.  CONTINUE TO MONITOR SYMPTOMS. FOLLOW UP IN 3 MOS.   

## 2015-01-12 NOTE — Progress Notes (Signed)
ON RECALL LIST  °

## 2015-03-02 ENCOUNTER — Other Ambulatory Visit: Payer: Self-pay

## 2015-03-02 DIAGNOSIS — Z1231 Encounter for screening mammogram for malignant neoplasm of breast: Secondary | ICD-10-CM

## 2015-03-04 ENCOUNTER — Emergency Department (HOSPITAL_COMMUNITY)
Admission: EM | Admit: 2015-03-04 | Discharge: 2015-03-04 | Disposition: A | Discharge status: Home / Self Care | Payer: BLUE CROSS/BLUE SHIELD | Attending: Emergency Medicine | Admitting: Emergency Medicine

## 2015-03-04 ENCOUNTER — Emergency Department (HOSPITAL_COMMUNITY): Payer: BLUE CROSS/BLUE SHIELD

## 2015-03-04 ENCOUNTER — Encounter (HOSPITAL_COMMUNITY): Payer: Self-pay | Admitting: Emergency Medicine

## 2015-03-04 DIAGNOSIS — R202 Paresthesia of skin: Secondary | ICD-10-CM | POA: Insufficient documentation

## 2015-03-04 DIAGNOSIS — R51 Headache: Secondary | ICD-10-CM | POA: Insufficient documentation

## 2015-03-04 DIAGNOSIS — Z79899 Other long term (current) drug therapy: Secondary | ICD-10-CM | POA: Insufficient documentation

## 2015-03-04 DIAGNOSIS — R42 Dizziness and giddiness: Secondary | ICD-10-CM | POA: Diagnosis not present

## 2015-03-04 DIAGNOSIS — K219 Gastro-esophageal reflux disease without esophagitis: Secondary | ICD-10-CM | POA: Diagnosis not present

## 2015-03-04 DIAGNOSIS — R519 Headache, unspecified: Secondary | ICD-10-CM

## 2015-03-04 LAB — CBC WITH DIFFERENTIAL/PLATELET
Basophils Absolute: 0 10*3/uL (ref 0.0–0.1)
Basophils Relative: 1 % (ref 0–1)
Eosinophils Absolute: 0.1 10*3/uL (ref 0.0–0.7)
Eosinophils Relative: 1 % (ref 0–5)
HCT: 37.6 % (ref 36.0–46.0)
Hemoglobin: 12.8 g/dL (ref 12.0–15.0)
Lymphocytes Relative: 67 % — ABNORMAL HIGH (ref 12–46)
Lymphs Abs: 3.6 10*3/uL (ref 0.7–4.0)
MCH: 30 pg (ref 26.0–34.0)
MCHC: 34 g/dL (ref 30.0–36.0)
MCV: 88.3 fL (ref 78.0–100.0)
Monocytes Absolute: 0.4 10*3/uL (ref 0.1–1.0)
Monocytes Relative: 7 % (ref 3–12)
Neutro Abs: 1.3 10*3/uL — ABNORMAL LOW (ref 1.7–7.7)
Neutrophils Relative %: 24 % — ABNORMAL LOW (ref 43–77)
Platelets: 294 10*3/uL (ref 150–400)
RBC: 4.26 MIL/uL (ref 3.87–5.11)
RDW: 13 % (ref 11.5–15.5)
WBC: 5.3 10*3/uL (ref 4.0–10.5)

## 2015-03-04 LAB — COMPREHENSIVE METABOLIC PANEL
ALT: 14 U/L (ref 14–54)
AST: 22 U/L (ref 15–41)
Albumin: 4 g/dL (ref 3.5–5.0)
Alkaline Phosphatase: 78 U/L (ref 38–126)
Anion gap: 10 (ref 5–15)
BUN: 10 mg/dL (ref 6–20)
CO2: 24 mmol/L (ref 22–32)
Calcium: 9.1 mg/dL (ref 8.9–10.3)
Chloride: 103 mmol/L (ref 101–111)
Creatinine, Ser: 0.76 mg/dL (ref 0.44–1.00)
GFR calc Af Amer: >60 mL/min (ref >60)
GFR calc non Af Amer: >60 mL/min (ref >60)
Glucose, Bld: 124 mg/dL — ABNORMAL HIGH (ref 65–99)
Potassium: 3.4 mmol/L — ABNORMAL LOW (ref 3.5–5.1)
Sodium: 137 mmol/L (ref 135–145)
Total Bilirubin: 0.4 mg/dL (ref 0.3–1.2)
Total Protein: 8.2 g/dL — ABNORMAL HIGH (ref 6.5–8.1)

## 2015-03-04 LAB — CBG MONITORING, ED: Glucose-Capillary: 125 mg/dL — ABNORMAL HIGH (ref 65–99)

## 2015-03-04 MED ORDER — SODIUM CHLORIDE 0.9 % IV BOLUS (SEPSIS)
1000.0000 mL | Freq: Once | INTRAVENOUS | Status: AC
  Administered 2015-03-04: 1000 mL via INTRAVENOUS

## 2015-03-04 MED ORDER — MECLIZINE HCL 25 MG PO TABS
25.0000 mg | ORAL_TABLET | Freq: Once | ORAL | Status: AC
  Administered 2015-03-04: 25 mg via ORAL
  Filled 2015-03-04: qty 1

## 2015-03-04 MED ORDER — BUTALBITAL-APAP-CAFFEINE 50-325-40 MG PO TABS: 1.0000 | ORAL_TABLET | Freq: Four times a day (QID) | ORAL | Status: AC | PRN

## 2015-03-04 MED ORDER — MECLIZINE HCL 25 MG PO TABS: 25.0000 mg | ORAL_TABLET | Freq: Three times a day (TID) | ORAL | Status: AC | PRN

## 2015-03-04 MED ORDER — METOCLOPRAMIDE HCL 5 MG/ML IJ SOLN
10.0000 mg | INTRAMUSCULAR | Status: AC
  Administered 2015-03-04: 10 mg via INTRAVENOUS
  Filled 2015-03-04: qty 2

## 2015-03-04 MED ORDER — DIPHENHYDRAMINE HCL 50 MG/ML IJ SOLN
25.0000 mg | Freq: Once | INTRAMUSCULAR | Status: AC
  Administered 2015-03-04: 25 mg via INTRAVENOUS
  Filled 2015-03-04: qty 1

## 2015-03-04 NOTE — Discharge Instructions (Signed)
Take the prescribed medication as directed.  Also recommend to keep close monitor of your blood pressure. Follow-up with your primary care physician.  Also included contact information for neurology. Return to the ED for new or worsening symptoms.

## 2015-03-04 NOTE — ED Notes (Signed)
The patient said she has been having a headache for two weeks and her left and left are numb.  She went to her MD but they sent her here.  She rates her pain 10/10.  Her MD said she has stroke like symptoms and should come to the ED.  The patient drove herself here.

## 2015-03-04 NOTE — ED Notes (Signed)
Patient returned from CT

## 2015-03-04 NOTE — ED Notes (Signed)
Patient transported to CT 

## 2015-03-04 NOTE — ED Provider Notes (Signed)
CSN: [id number]     Arrival date & time 03/04/15  1550 History   First MD Initiated Contact with Patient 03/04/15 1650     Chief Complaint  Patient presents with  . Headache    The patient said she has been having a headache for two weeks and her left and left are numb.  She went to her MD but they sent her here.     (Consider location/radiation/quality/duration/timing/severity/associated sxs/prior Treatment) Patient is a 59 y.o. female presenting with headaches. The history is provided by the patient and medical records.  Headache Associated symptoms: dizziness     This is a 59 year old female with past medical history significant for acid reflux and vertigo, presenting to the ED or headache. Patient states she has been having a daily headache for the past 2 weeks.  She states headache is always present upon waking, seems to get better during the day, but becomes intense again at night time when she tries to go to bed.  She reports associated dizziness which she describes as "off balance".  She does have hx of vertigo but has not been on medications for this in quite some time.  She also reports some paresthesias of her left arm and leg, also worse at night time.  She denies focal numbness or weakness.  No ataxia, confusion, visual disturbance, changes in speech, back pain, neck pain, fever, chills, sweats.  No chest pain or SOB.  Patient states she went to her PCP today and was told to come here for further evaluation.  Patient has no hx of stroke.  She does admit her BP has been fluctuating recently.  Not currently on meds for HTN-- tries to control with diet and exercise.  Past Medical History  Diagnosis Date  . GERD (gastroesophageal reflux disease)    Past Surgical History  Procedure Laterality Date  . Abdominal hysterectomy    . Esophagogastroduodenoscopy N/A 06/23/2014    SLF: 1. Schatzki ring at the gastroesophageal junction 2. MILD non-erosive gastritis.  Gaspar Bidding dilation N/A  06/23/2014    Procedure: SAVORY DILATION;  Surgeon: West Bali, MD;  Location: [facility];  Service: Endoscopy;  Laterality: N/A;   Family History  Problem Relation Age of Onset  . Colon cancer Maternal Aunt   . Colon polyps Neg Hx    History  Substance Use Topics  . Smoking status: Never Smoker   . Smokeless tobacco: Not on file  . Alcohol Use: No   OB History    No data available     Review of Systems  Neurological: Positive for dizziness and headaches.  All other systems reviewed and are negative.     Allergies  Review of patient's allergies indicates no known allergies.  Home Medications   Prior to Admission medications   Medication Sig Start Date End Date Taking? Authorizing Provider  esomeprazole (NEXIUM) 20 MG capsule Take 20 mg by mouth daily at 12 noon.   Yes Historical Provider, MD  ibuprofen (ADVIL,MOTRIN) 200 MG tablet Take 600 mg by mouth daily as needed for headache (pain).   Yes Historical Provider, MD  esomeprazole (NEXIUM) 40 MG capsule 1 PO 30 MINUTES PRIOR TO FIRST MEAL Patient not taking: Reported on 03/04/2015 06/16/14   West Bali, MD   BP 170/96 mmHg  Pulse 72  Temp(Src) 98 F (36.7 C) (Oral)  Resp 16  Ht 4\' 11"  (1.499 m)  Wt 158 lb (71.668 kg)  BMI 31.89 kg/m2  SpO2 99%  Physical Exam  Constitutional: She is oriented to person, place, and time. She appears well-developed and well-nourished. No distress.  HENT:  Head: Normocephalic and atraumatic.  Mouth/Throat: Oropharynx is clear and moist.  Eyes: Conjunctivae and EOM are normal. Pupils are equal, round, and reactive to light.  Neck: Normal range of motion and full passive range of motion without pain. Neck supple. No rigidity.  No meningismus, no rigidity  Cardiovascular: Normal rate, regular rhythm and normal heart sounds.   Pulmonary/Chest: Effort normal and breath sounds normal. No respiratory distress. She has no wheezes.  Abdominal: Soft. Bowel sounds are normal. There  is no tenderness. There is no guarding.  Musculoskeletal: Normal range of motion. She exhibits no edema.  Neurological: She is alert and oriented to person, place, and time.  AAOx3, answering questions appropriately; equal strength UE and LE bilaterally; CN grossly intact; moves all extremities appropriately without ataxia; no focal neuro deficits or facial asymmetry appreciated  Skin: Skin is warm and dry. She is not diaphoretic.  Psychiatric: She has a normal mood and affect.  Nursing note and vitals reviewed.   ED Course  Procedures (including critical care time) Labs Review Labs Reviewed  CBC WITH DIFFERENTIAL/PLATELET - Abnormal; Notable for the following:    Neutrophils Relative % 24 (*)    Neutro Abs 1.3 (*)    Lymphocytes Relative 67 (*)    All other components within normal limits  COMPREHENSIVE METABOLIC PANEL - Abnormal; Notable for the following:    Potassium 3.4 (*)    Glucose, Bld 124 (*)    Total Protein 8.2 (*)    All other components within normal limits  CBG MONITORING, ED - Abnormal; Notable for the following:    Glucose-Capillary 125 (*)    All other components within normal limits    Imaging Review No results found.   EKG Interpretation None      MDM   Final diagnoses:  Headache   59 y.o. F with daily headache x2 weeks, waxing and waning throughout each day.  No hx of migraines.   Notes some paresthesias of left arm/leg that occur at night.  No focal weakness or numbness.  Patient is neurologically intact, without focal deficits here.  No RF for stroke.  Does have hx of vertigo as well.  No clinical signs of meningitis.  Lab work reassuring.  CT head also negative.  Patient was treated in ED with benadryl, reglan, IVF, and meclizine with resolution of symptoms, states she is feeling much better.  Neurologic exam remains non-focal.  Given negative work up here with resolution of symptoms, i have low suspicion for acute stroke and do not feel patient needs  further work-up.  Headache may be due to fluctuating BP vs cluster headaches.  Patient will be d/c home with fioricet and meclizine for symptomatic control.  She has FU with her PCP next week.  Also given referral to neurology.  Discussed plan with patient, he/she acknowledged understanding and agreed with plan of care.  Return precautions given for new or worsening symptoms.  Case discussed with attending physician, Dr. Juleen China, who agrees with assessment and plan of care.  Garlon Hatchet, PA-C 03/04/15 1909  Raeford Razor, MD 03/04/15 [phone]

## 2015-03-08 ENCOUNTER — Encounter: Payer: Self-pay | Admitting: Gastroenterology

## 2015-03-28 ENCOUNTER — Ambulatory Visit
Admission: RE | Admit: 2015-03-28 | Discharge: 2015-03-28 | Disposition: A | Discharge status: Home / Self Care | Payer: BLUE CROSS/BLUE SHIELD | Source: Ambulatory Visit

## 2015-03-28 DIAGNOSIS — Z1231 Encounter for screening mammogram for malignant neoplasm of breast: Secondary | ICD-10-CM

## 2015-03-30 ENCOUNTER — Ambulatory Visit (INDEPENDENT_AMBULATORY_CARE_PROVIDER_SITE_OTHER): Payer: BLUE CROSS/BLUE SHIELD | Admitting: Neurology

## 2015-03-30 ENCOUNTER — Encounter: Payer: Self-pay | Admitting: Neurology

## 2015-03-30 VITALS — BP 162/92 | HR 48 | Ht 59.0 in | Wt 161.6 lb

## 2015-03-30 DIAGNOSIS — M542 Cervicalgia: Secondary | ICD-10-CM

## 2015-03-30 DIAGNOSIS — M25519 Pain in unspecified shoulder: Secondary | ICD-10-CM | POA: Diagnosis not present

## 2015-03-30 DIAGNOSIS — M79605 Pain in left leg: Secondary | ICD-10-CM | POA: Diagnosis not present

## 2015-03-30 DIAGNOSIS — R200 Anesthesia of skin: Secondary | ICD-10-CM

## 2015-03-30 MED ORDER — PREGABALIN 50 MG PO CAPS: 50.0000 mg | ORAL_CAPSULE | Freq: Three times a day (TID) | ORAL | Status: DC

## 2015-03-30 NOTE — Progress Notes (Signed)
[facility] [address] [place]. [place] [address] [phone]       OFFICE CONSULT NOTE  Ms. NARELLE SCHOENING Date of Birth:  October 21, 1955 Medical Record Number:  [id number]   Referring MD:  J Hungarland Reason for Referral:  Neck and extremity pain and numbness.  HPI: 53 year pleasant African-American lady who's been having intermittent and progressive symptoms for the last 1 year. She states this initially began with arm pain after the death of her mother and subsequently started having intermittent transient pain involving the left, times left leg at times and posterior neck as well as headaches. The symptoms progressed over the last 6 months to become quite bothersome. She has also had some intermittent numbness in the left side. She denies any double vision, gait or balance problems. She denies any bladder or bowel urgency. She also complains of posterior neck and head pain which he describes as moderate L in nature which is now constant for the last several months. She was seen in the emergency room on 03/04/15 and was diagnosed with tension headache. CT scan of the head was obtained which are personally reviewed and was normal. She states that the pain is maximum in the back of the head but at times does radiate into her left shoulder. She also has similar pain in the left buttock which radiates down the lateral aspect of the left leg. She was seen by primary physician who started her on nortriptyline which she has taken for a couple of weeks but feels it does not help. She has tried Tylenol and ibuprofen which have not worked either. She was given Fioricet for headaches but she did not like the side effects and it did not help either. She denies any recent falls or head injury neck injury or known degenerative spine disease. She denies any history of rash, recurrent miscarriages no family members with similar illness. She has not tried gabapentin, Topamax and Lyrica. She has  not had any spine imaging studies done.  ROS:   14 system review of systems is positive for  headache, neck pain, arm pain, thigh pain, numbness, joint pain, aching muscles, fatigue, memory loss, headache, numbness, sleepiness, depression, not enough sleep, decreased energy and all other systems negative  PMH:  Past Medical History  Diagnosis Date  . GERD (gastroesophageal reflux disease)   . Headache   . Memory loss     Social History:  History   Social History  . Marital Status: Married    Spouse Name: N/A  . Number of Children: 1  . Years of Education: 12   Occupational History  . commisioner of revenue     Social History Main Topics  . Smoking status: Never Smoker   . Smokeless tobacco: Not on file  . Alcohol Use: No  . Drug Use: No  . Sexual Activity: Not on file   Other Topics Concern  . Not on file   Social History Narrative   Drinks 1 cup of coffee a day     Medications:   Current Outpatient Prescriptions on File Prior to Visit  Medication Sig Dispense Refill  . esomeprazole (NEXIUM) 20 MG capsule Take 20 mg by mouth daily at 12 noon.    . butalbital-acetaminophen-caffeine (FIORICET) 50-325-40 MG per tablet Take 1 tablet by mouth every 6 (six) hours as needed for headache. (Patient not taking: Reported on 03/30/2015) 20 tablet 0  . esomeprazole (NEXIUM) 40 MG capsule 1 PO 30 MINUTES PRIOR TO  FIRST MEAL (Patient not taking: Reported on 03/04/2015) 30 capsule 11  . ibuprofen (ADVIL,MOTRIN) 200 MG tablet Take 600 mg by mouth daily as needed for headache (pain).    . meclizine (ANTIVERT) 25 MG tablet Take 1 tablet (25 mg total) by mouth 3 (three) times daily as needed. (Patient not taking: Reported on 03/30/2015) 30 tablet 0   No current facility-administered medications on file prior to visit.    Allergies:  No Known Allergies  Physical Exam General: well developed, well nourished young African-American lady, seated, in no evident distress Head: head  normocephalic and atraumatic.   Neck: supple with no carotid or supraclavicular bruits. Mild spasm of posterior neck and interscapular muscles with slight restriction of movements. No trigger points noted Cardiovascular: regular rate and rhythm, no murmurs Musculoskeletal: no deformity Skin:  no rash/petichiae Vascular:  Normal pulses all extremities  Neurologic Exam Mental Status: Awake and fully alert. Oriented to place and time. Recent and remote memory intact. Attention span, concentration and fund of knowledge appropriate. Mood and affect appropriate.  Cranial Nerves: Fundoscopic exam reveals sharp disc margins. Pupils equal, briskly reactive to light. Extraocular movements full without nystagmus. Visual fields full to confrontation. Hearing intact. Facial sensation intact. Face, tongue, palate moves normally and symmetrically.  Motor: Normal bulk and tone. Normal strength in all tested extremity muscles. Sensory.: intact to touch , pinprick , position and vibratory sensation.  Coordination: Rapid alternating movements normal in all extremities. Finger-to-nose and heel-to-shin performed accurately bilaterally. Gait and Station: Arises from chair without difficulty. Stance is normal. Gait demonstrates normal stride length and balance . Able to heel, toe and tandem walk without difficulty.  Reflexes: 1+ and symmetric. Toes downgoing.    ASSESSMENT: 66  Year pleasant African-American lady with one year history of progressive posterior headache, neck pain, arm pain and paresthesias of undetermined etiology. Neurological exam is fairly unremarkable Possibilities include autoimmune, inflammatory or demyelinating conditions or fibromyalgia    PLAN: I had a long discussion with the patient with regards to her posterior head pain, neck pain and radicular left arm and leg pain discussed my differential diagnosis, plan for evaluation and answered questions. I recommend a trial of Lyrica 50 mg 3  times daily and if tolerated without side effect increase further. I discussed possible side effects with the patient and advised her to call me if needed. Check MRI scan of the brain and cervical spine as well as EMG nerve conduction studies for structural or compressive lesions and check ANA, ESR , B12, TSH,and Lyme titer. Return for follow-up in 2 months or call earlier if necessary.  Antony Contras, MD  Note: This document was prepared with digital dictation and possible smart phrase technology. Any transcriptional errors that result from this process are unintentional.

## 2015-03-30 NOTE — Patient Instructions (Signed)
I had a long discussion with the patient with regards to her posterior head pain, neck pain and radicular left arm and leg pain discussed my differential diagnosis, plan for evaluation and answered questions. I recommend a trial of Lyrica 50 mg 3 times daily and if tolerated without side effect increase further. I discussed possible side effects with the patient and advised her to call me if needed. Check MRI scan of the brain and cervical spine as well as EMG nerve conduction studies for structural or compressive lesions and check ANA, ESR , B12, TSH,and Lyme titer. Return for follow-up in 2 months or call earlier if necessary.

## 2015-03-31 LAB — ANA: ANA: NEGATIVE

## 2015-03-31 LAB — SEDIMENTATION RATE: Sed Rate: 10 mm/hr (ref 0–40)

## 2015-03-31 LAB — LYME AB/WESTERN BLOT REFLEX
LYME DISEASE AB, QUANT, IGM: <0.8 index (ref 0.00–0.79)
Lyme IgG/IgM Ab: <0.91 {ISR} (ref 0.00–0.90)

## 2015-03-31 LAB — TSH: TSH: 1.33 u[IU]/mL (ref 0.450–4.500)

## 2015-03-31 LAB — VITAMIN B12: Vitamin B-12: 455 pg/mL (ref 211–946)

## 2015-04-03 ENCOUNTER — Telehealth: Payer: Self-pay

## 2015-04-03 NOTE — Telephone Encounter (Signed)
LVM for patient to call office back to receive labs results, including B12, ANA, ESR and Lyme antibody tests were all normal.

## 2015-04-04 NOTE — Telephone Encounter (Signed)
I called the patient and informed her that her labs were normal.

## 2015-04-04 NOTE — Telephone Encounter (Signed)
Pt  Called back about her lab results. Please call [phone], or [phone] Cell , states that you may leave a message about her results on the phone.

## 2015-04-05 ENCOUNTER — Other Ambulatory Visit: Payer: Self-pay

## 2015-04-05 ENCOUNTER — Ambulatory Visit (INDEPENDENT_AMBULATORY_CARE_PROVIDER_SITE_OTHER): Payer: BLUE CROSS/BLUE SHIELD

## 2015-04-05 ENCOUNTER — Telehealth: Payer: Self-pay

## 2015-04-05 DIAGNOSIS — M542 Cervicalgia: Secondary | ICD-10-CM | POA: Diagnosis not present

## 2015-04-05 DIAGNOSIS — M79605 Pain in left leg: Secondary | ICD-10-CM

## 2015-04-05 DIAGNOSIS — M25519 Pain in unspecified shoulder: Secondary | ICD-10-CM | POA: Diagnosis not present

## 2015-04-05 DIAGNOSIS — R200 Anesthesia of skin: Secondary | ICD-10-CM

## 2015-04-05 MED ORDER — TOPIRAMATE 25 MG PO TABS: 25.0000 mg | ORAL_TABLET | Freq: Two times a day (BID) | ORAL | Status: DC

## 2015-04-05 NOTE — Telephone Encounter (Signed)
Misty Stanley states that she is unable to tolerate the Lyrica. Reports that she is unable to function at work and causes stomach discomfort. She has tried just taking it at night, but still feels "groggy" the next day. [name] asked if she can try prescription Ibuprofen 800mg . States that she has been on it in the past and reported relief of headaches.

## 2015-04-05 NOTE — Telephone Encounter (Signed)
Med list updated.  Rx has been sent.  Patient is aware.

## 2015-04-05 NOTE — Telephone Encounter (Signed)
Misty Stanley had an MRI and stopped by to see what could be done.Marland Kitchen.Marland Kitchen.She stated Dr Pearlean BrownieSethi.Marland Kitchen.Marland Kitchen.Gave her an RX that is not working and she was wondering what else there was that can be given...please advise

## 2015-04-05 NOTE — Telephone Encounter (Signed)
Stop lyrica and try topamax 25 mg twice daily instead. Ibuprofen 800 mg is likely to be harmful if taken for a long period of time

## 2015-04-05 NOTE — Telephone Encounter (Signed)
I called back and spoke with the patient.  Relayed providers message.  Patient expressed understanding and is agreeable to this plan.  She will call us back if anything further is needed.

## 2015-04-05 NOTE — Telephone Encounter (Signed)
Micki RileyPramod S Sethi, MD at 04/05/2015 3:34 PM     Status: Signed       Expand All Collapse All   Stop lyrica and try topamax 25 mg twice daily instead. Ibuprofen 800 mg is likely to be harmful if taken for a long period of time

## 2015-04-06 MED ORDER — GADOPENTETATE DIMEGLUMINE 469.01 MG/ML IV SOLN: 17.0000 mL | Freq: Once | INTRAVENOUS | Status: AC | PRN

## 2015-04-10 ENCOUNTER — Telehealth: Payer: Self-pay

## 2015-04-10 NOTE — Telephone Encounter (Signed)
Calling to inform patient that MRI scan of the brain is fine and shows only minor age-related changes of wear and tear,  MRI scan of the neck also shows mild wear and tear findings but without significant pinched nerve or spinal stenosis. She has been asked to call office back to receive results.

## 2015-04-11 NOTE — Telephone Encounter (Signed)
Patient called/returning Misty Stanley's call in regards to MRI results. Please call and advise. Patient can be reached @ [phone]

## 2015-04-11 NOTE — Telephone Encounter (Addendum)
Patient returned call, please call and advise. [phone])

## 2015-04-12 NOTE — Telephone Encounter (Signed)
error 

## 2015-04-12 NOTE — Telephone Encounter (Signed)
Spoke with patient to give her MRI results, she verbalized understanding.  Patient also requested appt with Dr. Pearlean BrownieSethi be switched to Dr. Lucia GaskinsAhern; patients feels more comfortable with female provider.

## 2015-04-17 ENCOUNTER — Ambulatory Visit (INDEPENDENT_AMBULATORY_CARE_PROVIDER_SITE_OTHER): Payer: BLUE CROSS/BLUE SHIELD | Admitting: Neurology

## 2015-04-17 ENCOUNTER — Ambulatory Visit (INDEPENDENT_AMBULATORY_CARE_PROVIDER_SITE_OTHER): Payer: Self-pay | Admitting: Neurology

## 2015-04-17 DIAGNOSIS — M25562 Pain in left knee: Secondary | ICD-10-CM

## 2015-04-17 DIAGNOSIS — M25519 Pain in unspecified shoulder: Secondary | ICD-10-CM

## 2015-04-17 DIAGNOSIS — M79605 Pain in left leg: Secondary | ICD-10-CM

## 2015-04-17 DIAGNOSIS — M79622 Pain in left upper arm: Secondary | ICD-10-CM

## 2015-04-17 DIAGNOSIS — M25522 Pain in left elbow: Secondary | ICD-10-CM

## 2015-04-17 DIAGNOSIS — M542 Cervicalgia: Secondary | ICD-10-CM

## 2015-04-17 DIAGNOSIS — R200 Anesthesia of skin: Secondary | ICD-10-CM

## 2015-04-17 NOTE — Procedures (Signed)
     HISTORY:  Misty Stanley is a 59 year old patient with a one-year history of sharp pains involving the left arm and left leg associated with some sensation of weakness. She describes some neck discomfort. She is being evaluated for a possible neuropathy or a radiculopathy.  NERVE CONDUCTION STUDIES:  Nerve conduction studies were performed on both upper extremities. The distal motor latencies and motor amplitudes for the median and ulnar nerves were within normal limits. The F wave latencies and nerve conduction velocities for these nerves were also normal. The sensory latencies for the median and ulnar nerves were normal.  Nerve conduction studies were performed on the left lower extremity. The distal motor latencies and motor amplitudes for the peroneal and posterior tibial nerves were within normal limits. The nerve conduction velocities for these nerves were also normal. The H reflex latency was normal. The sensory latency for the peroneal nerve was within normal limits.   EMG STUDIES:  EMG study was performed on the left upper extremity:  The first dorsal interosseous muscle reveals 2 to 4 K units with full recruitment. No fibrillations or positive waves were noted. The abductor pollicis brevis muscle reveals 2 to 4 K units with full recruitment. No fibrillations or positive waves were noted. The extensor indicis proprius muscle reveals 1 to 3 K units with full recruitment. No fibrillations or positive waves were noted. The pronator teres muscle reveals 2 to 3 K units with full recruitment. No fibrillations or positive waves were noted. The biceps muscle reveals 1 to 2 K units with full recruitment. No fibrillations or positive waves were noted. The triceps muscle reveals 2 to 4 K units with full recruitment. No fibrillations or positive waves were noted. The anterior deltoid muscle reveals 2 to 3 K units with full recruitment. No fibrillations or positive waves were noted. The  cervical paraspinal muscles were tested at 2 levels. No abnormalities of insertional activity were seen at either level tested. There was good relaxation.   EMG study was performed on the left lower extremity:  The tibialis anterior muscle reveals 2 to 4K motor units with full recruitment. No fibrillations or positive waves were seen. The peroneus tertius muscle reveals 2 to 4K motor units with full recruitment. No fibrillations or positive waves were seen. The medial gastrocnemius muscle reveals 1 to 3K motor units with full recruitment. No fibrillations or positive waves were seen. The vastus lateralis muscle reveals 2 to 4K motor units with full recruitment. No fibrillations or positive waves were seen. The iliopsoas muscle reveals 2 to 4K motor units with full recruitment. No fibrillations or positive waves were seen. The biceps femoris muscle (long head) reveals 2 to 4K motor units with full recruitment. No fibrillations or positive waves were seen. The lumbosacral paraspinal muscles were tested at 3 levels, and revealed no abnormalities of insertional activity at all 3 levels tested. There was good relaxation.   IMPRESSION:  Nerve conduction studies done on both upper extremities and on the left lower extremity were unremarkable, no evidence of a neuropathy is seen. EMG evaluation on the left upper and left lower extremities were unremarkable, without evidence of an overlying cervical or lumbar radiculopathy.  Marlan Palau MD 04/17/2015 1:59 PM  [facility] [address] [place], [place] [address]  Phone [phone] Fax [phone]

## 2015-04-17 NOTE — Progress Notes (Signed)
Please refer to EMG and NCV procedure note. 

## 2015-04-18 ENCOUNTER — Telehealth: Payer: Self-pay

## 2015-04-18 NOTE — Telephone Encounter (Signed)
Informed patient that nerve conduction EMG studies are normal.  Patient verbalized understanding

## 2015-04-26 ENCOUNTER — Telehealth: Payer: Self-pay

## 2015-04-26 NOTE — Telephone Encounter (Signed)
LVM for pt, OK to inform her that her EMG results were normal.

## 2015-05-19 ENCOUNTER — Telehealth: Payer: Self-pay | Admitting: Neurology

## 2015-05-19 NOTE — Telephone Encounter (Signed)
Left message for patient to come early to the office at 12:45 instead on Monday. She was scheduled in a 15 minute appointment due to rescheduling issues which I approved. But since I have never sene ehr before, I need 30 minutes. If eh gets here at 12:45  Can see her from 1-1:30.   Misty Stanley - would you let the front desk know I will be seeing her at 1pm instead? Maybe we can even change that on the schedule. Thanks

## 2015-05-22 ENCOUNTER — Ambulatory Visit (INDEPENDENT_AMBULATORY_CARE_PROVIDER_SITE_OTHER): Payer: BLUE CROSS/BLUE SHIELD | Admitting: Neurology

## 2015-05-22 ENCOUNTER — Encounter: Payer: Self-pay | Admitting: Neurology

## 2015-05-22 VITALS — BP 141/89 | HR 64 | Ht 59.0 in | Wt 160.0 lb

## 2015-05-22 DIAGNOSIS — M79602 Pain in left arm: Secondary | ICD-10-CM

## 2015-05-22 DIAGNOSIS — R519 Headache, unspecified: Secondary | ICD-10-CM | POA: Insufficient documentation

## 2015-05-22 DIAGNOSIS — R51 Headache: Secondary | ICD-10-CM

## 2015-05-22 DIAGNOSIS — G4719 Other hypersomnia: Secondary | ICD-10-CM | POA: Diagnosis not present

## 2015-05-22 DIAGNOSIS — R0683 Snoring: Secondary | ICD-10-CM

## 2015-05-22 DIAGNOSIS — M791 Myalgia: Secondary | ICD-10-CM | POA: Diagnosis not present

## 2015-05-22 DIAGNOSIS — M5416 Radiculopathy, lumbar region: Secondary | ICD-10-CM | POA: Diagnosis not present

## 2015-05-22 DIAGNOSIS — M7918 Myalgia, other site: Secondary | ICD-10-CM

## 2015-05-22 MED ORDER — NEBIVOLOL HCL 5 MG PO TABS: 5.0000 mg | ORAL_TABLET | Freq: Every day | ORAL | Status: DC

## 2015-05-22 MED ORDER — CYCLOBENZAPRINE HCL 5 MG PO TABS: 5.0000 mg | ORAL_TABLET | Freq: Every day | ORAL | Status: DC

## 2015-05-22 NOTE — Telephone Encounter (Signed)
Dr. Lucia Gaskins-- I put a note in the notes section so the front staff will know its okay if she checks in early, thank you!

## 2015-05-22 NOTE — Progress Notes (Signed)
[facility]    Provider:  Dr Jaynee Eagles Referring Provider: Yvone Stanley,* Primary Care Physician:  Misty Neu, MD   HPI:  Misty Stanley is a 59 y.o. female here as a referral from [name] for headache and left-sided muscular pain. She is transitioning from Dr. Leonie Stanley to me for headaches. She has neck pain. She wakes up at 2am with pounding in the head, headaches. Throbbing. She also wakes up with neck pain. Husband says she snores excessively, it is so loud she vibrates the bed. Excessively tired during the day. Everything on her body hurts. Pain is muscular, tight like it needs to be worked out in the left neck and low back. No weakness but there is pain. The left leg is tight as well. She sleeps on that side. She has shooting pain, sharp and achy all the way down to the foot. The back is chronic, but no weakness just a lot of pain.   Headaches started 6-7 months ago. Left-sided muscular pain was over a year ago in onset. Headaches are daily and in the morning. The headaches are not during the day, only nocturnally and in the morning. Left-sided pain is getting worse. Frequent awakenings and severe snoring with apneic events. Left-sided muscular pain is worse apon waking. No light sensitivity, sound sensitivity.   Reviewed notes, labs and imaging from outside physicians, which showed:   Nerve conduction studies done on both upper extremities and on the left lower extremity were unremarkable, no evidence of a neuropathy is seen. EMG evaluation on the left upper and left lower extremities were unremarkable, without evidence of an overlying cervical or lumbar radiculopathy. Personally reviewed data and images.  MRI cervical spine:personally reviewed.   FINDINGS: : On sagittal images, the spine is imaged from above the cervicomedullary junction to T2T3. Paravertebral soft tissue appears normal. The spinal cord is of normal caliber and signal. The  vertebral bodies are normally aligned. The vertebral bodies have normal signal. The discs and interspaces were further evaluated on axial views from C2 to T1 as follows: C2 - C3: The disc and interspace appear normal. C3 - C4: There is mild left greater than right uncovertebral spurring with minimal disc bulging. There is mild bilateral foraminal narrowing but no nerve root compression. . C4 - C5: There is minimal disc bulging. There is no significant foraminal narrowing and no nerve root impingement. C5 - C6: There is mild bilateral uncovertebral spurring and minimal disc bulging. The neural foramina are mildly narrowed but there does not appear to be any nerve root impingement.. C6 - C7: There is mild bilateral uncovertebral spurring and 4 disc protrusion. The neural foramina are mildly narrowed but there is no nerve root impingement.. C7 - T1: The disc and interspace appear normal.   IMPRESSION: This is an abnormal MRI of the cervical spine showing mild multilevel degenerative changes as detailed above. There does not appear to be any nerve root impingement at any of the cervical levels.  MRi of the brain: personally reviewd images IMPRESSION: This is a normal age appropriate MRI of the head with and without contrast. There are a few punctate T2/FLAIR hyperintense foci in the subcortical white matter of the left frontal lobe likely due to minimal age-appropriate small vessel ischemic change. There are no acute findings  Unremarkable lans: ANA, esr, lyme, b12. Tsh, cbc, cmp  Previous office notes Dr. Leonie Stanley: 03/30/2015:  HPI: 79 year pleasant African-American lady who's been having intermittent and progressive symptoms for  the last 1 year. She states this initially began with arm pain after the death of her mother and subsequently started having intermittent transient pain involving the left, times left leg at times and posterior neck as well as headaches. The symptoms progressed over  the last 6 months to become quite bothersome. She has also had some intermittent numbness in the left side. She denies any double vision, gait or balance problems. She denies any bladder or bowel urgency. She also complains of posterior neck and head pain which he describes as moderate L in nature which is now constant for the last several months. She was seen in the emergency room on 03/04/15 and was diagnosed with tension headache. CT scan of the head was obtained which are personally reviewed and was normal. She states that the pain is maximum in the back of the head but at times does radiate into her left shoulder. She also has similar pain in the left buttock which radiates down the lateral aspect of the left leg. She was seen by primary physician who started her on nortriptyline which she has taken for a couple of weeks but feels it does not help. She has tried Tylenol and ibuprofen which have not worked either. She was given Fioricet for headaches but she did not like the side effects and it did not help either. She denies any recent falls or head injury neck injury or known degenerative spine disease. She denies any history of rash, recurrent miscarriages no family members with similar illness. She has not tried gabapentin, Topamax and Lyrica. She has not had any spine imaging studies done.    Review of Systems: Patient complains of symptoms per HPI as well as the following symptoms: Joint pain, back pain, aching muscles, walking difficulty, neck pain, neck stiffness, headache, numbness . Pertinent negatives per HPI. All others negative.   Social History   Social History  . Marital Status: Married    Spouse Name: Misty Stanley  . Number of Children: 1  . Years of Education: 12   Occupational History  . commisioner of revenue     Social History Main Topics  . Smoking status: Never Smoker   . Smokeless tobacco: Not on file  . Alcohol Use: No  . Drug Use: No  . Sexual Activity: Not on file    Other Topics Concern  . Not on file   Social History Narrative   Lives at home with husband, Misty Stanley.   Drinks 1 cup of coffee a day     Family History  Problem Relation Age of Onset  . Colon polyps Neg Hx   . Migraines Neg Hx   . Cancer Mother   . Breast cancer Paternal Aunt   . Heart failure Maternal Grandmother   . Diabetes Maternal Grandfather   . Multiple sclerosis Maternal Aunt     Past Medical History  Diagnosis Date  . GERD (gastroesophageal reflux disease)   . Headache   . Memory loss     Past Surgical History  Procedure Laterality Date  . Abdominal hysterectomy    . Esophagogastroduodenoscopy N/A 06/23/2014    SLF: 1. Schatzki ring at the gastroesophageal junction 2. MILD non-erosive gastritis.  Azzie Almas dilation N/A 06/23/2014    Procedure: SAVORY DILATION;  Surgeon: Danie Binder, MD;  Location: [facility];  Service: Endoscopy;  Laterality: N/A;    Current Outpatient Prescriptions  Medication Sig Dispense Refill  . estradiol (CLIMARA - DOSED IN MG/24 HR) 0.1 mg/24hr  patch Place 1 patch onto the skin once a week.  0  . butalbital-acetaminophen-caffeine (FIORICET) 50-325-40 MG per tablet Take 1 tablet by mouth every 6 (six) hours as needed for headache. (Patient not taking: Reported on 03/30/2015) 20 tablet 0  . cyclobenzaprine (FLEXERIL) 5 MG tablet Take 1 tablet (5 mg total) by mouth at bedtime. 30 tablet 9  . esomeprazole (NEXIUM) 20 MG capsule Take 20 mg by mouth daily at 12 noon.    Marland Kitchen esomeprazole (NEXIUM) 40 MG capsule 1 PO 30 MINUTES PRIOR TO FIRST MEAL (Patient not taking: Reported on 03/04/2015) 30 capsule 11  . ibuprofen (ADVIL,MOTRIN) 200 MG tablet Take 600 mg by mouth daily as needed for headache (pain).    . meclizine (ANTIVERT) 25 MG tablet Take 1 tablet (25 mg total) by mouth 3 (three) times daily as needed. (Patient not taking: Reported on 03/30/2015) 30 tablet 0  . nebivolol (BYSTOLIC) 5 MG tablet Take 1 tablet (5 mg total) by mouth daily. Pt  stated she ran out of medication. Only got 30 day supply. 30 tablet 6  . topiramate (TOPAMAX) 25 MG tablet Take 1 tablet (25 mg total) by mouth 2 (two) times daily. (Patient not taking: Reported on 05/22/2015) 60 tablet 3   No current facility-administered medications for this visit.    Allergies as of 05/22/2015  . (No Known Allergies)    Vitals: BP 141/89 mmHg  Pulse 64  Ht 4' 11" (1.499 m)  Wt 160 lb (72.576 kg)  BMI 32.30 kg/m2 Last Weight:  Wt Readings from Last 1 Encounters:  05/22/15 160 lb (72.576 kg)   Last Height:   Ht Readings from Last 1 Encounters:  05/22/15 4' 11" (1.499 m)    Physical exam: Exam: Gen: NAD, conversant, well nourised, obese, well groomed                     CV: RRR, no MRG. No Carotid Bruits. No peripheral edema, warm, nontender Eyes: Conjunctivae clear without exudates or hemorrhage  Neuro: Detailed Neurologic Exam  Speech:    Speech is normal; fluent and spontaneous with normal comprehension.  Cognition:    The patient is oriented to person, place, and time;     recent and remote memory intact;     language fluent;     normal attention, concentration,     fund of knowledge Cranial Nerves:    The pupils are equal, round, and reactive to light. The fundi are normal and spontaneous venous pulsations are present. Visual fields are full to finger confrontation. Extraocular movements are intact. Trigeminal sensation is intact and the muscles of mastication are normal. The face is symmetric. The palate elevates in the midline. Hearing intact. Voice is normal. Shoulder shrug is normal. The tongue has normal motion without fasciculations.   Coordination:    Normal finger to nose and heel to shin. Normal rapid alternating movements.   Gait:    Heel-toe and tandem gait are normal.   Motor Observation:    No asymmetry, no atrophy, and no involuntary movements noted. Tone:    Normal muscle tone.    Posture:    Posture is normal. normal  erect    Strength:    Left biceps femoris mild weakness     Sensation: intact to LT     Reflex Exam:  DTR's:    Deep tendon reflexes in the upper and lower extremities are normal bilaterally.   Toes:    The toes are downgoing  bilaterally.   Clonus:    Clonus is absent.      Assessment/Plan:  59 year old female with left-sided neck, arm and leg pain and morning headaches with excessive snoring and apneic events.   Water aerobics and massage, weight loss and diet (write letter) PT for musculoskeletal neck pain and lower radicular symptoms on the left Flexeril before bed for muscular pain Sleep evaluation for OSA and hypoventilation syndrome MRi of the lumbar spine  Sarina Ill, MD  [facility] [address] [place], [place] [address]  Phone [phone] Fax [phone]

## 2015-05-22 NOTE — Patient Instructions (Signed)
Overall you are doing fairly well but I do want to suggest a few things today:   Remember to drink plenty of fluid, eat healthy meals and do not skip any meals. Try to eat protein with a every meal and eat a healthy snack such as fruit or nuts in between meals. Try to keep a regular sleep-wake schedule and try to exercise daily, particularly in the form of walking, 20-30 minutes a day, if you can.   Water aerobics and massage (write letter) PT Flexeril before bed Sleep evaluation MRi of the lumbar spine I would like to see you back in 3 months, sooner if we need to. Please call us with any interim questions, concerns, problems, updates or refill requests.   Please also call us for any test results so we can go over those with you on the phone.  My clinical assistant and will answer any of your questions and relay your messages to me and also relay most of my messages to you.   Our phone number is [phone]. We also have an after hours call service for urgent matters and there is a physician on-call for urgent questions. For any emergencies you know to call 911 or go to the nearest emergency room

## 2015-05-22 NOTE — Telephone Encounter (Signed)
Thank you :)

## 2015-05-28 ENCOUNTER — Encounter: Payer: Self-pay | Admitting: Neurology

## 2015-05-30 ENCOUNTER — Telehealth: Payer: Self-pay | Admitting: *Deleted

## 2015-05-30 NOTE — Telephone Encounter (Signed)
Called and spoke w/ pt to let her know letter is ready. She would like letter faxed to her at [phone].

## 2015-05-30 NOTE — Telephone Encounter (Signed)
-----   Message from Anson Fret, MD sent at 05/28/2015  6:11 PM EDT ----- Kara Mead - I wrote this patient a letter. Can you get it to her please? thanks

## 2015-05-31 NOTE — Telephone Encounter (Signed)
Faxed letter and received fax confirmation.

## 2015-06-05 ENCOUNTER — Telehealth: Payer: Self-pay | Admitting: Neurology

## 2015-06-05 NOTE — Telephone Encounter (Signed)
[facility] [phone] we sent referral over 05/23/15 and [facility] faxed over their form on the 6th. It has all information they need except what type of study to be done. Please call to advise what tepe of study.

## 2015-06-05 NOTE — Telephone Encounter (Signed)
Spoke with Aurther Loft from [facility]. She is going to refax order form sheet that needs to specify which studies Dr. Lucia Gaskins wants to [phone]. Gave her my name.

## 2015-06-07 ENCOUNTER — Ambulatory Visit: Payer: BLUE CROSS/BLUE SHIELD | Admitting: Neurology

## 2015-06-07 ENCOUNTER — Ambulatory Visit: Payer: Self-pay | Admitting: Neurology

## 2015-06-14 ENCOUNTER — Institutional Professional Consult (permissible substitution): Payer: BLUE CROSS/BLUE SHIELD | Admitting: Neurology

## 2015-06-14 ENCOUNTER — Telehealth: Payer: Self-pay

## 2015-06-14 ENCOUNTER — Ambulatory Visit (INDEPENDENT_AMBULATORY_CARE_PROVIDER_SITE_OTHER): Payer: BLUE CROSS/BLUE SHIELD

## 2015-06-14 DIAGNOSIS — M5416 Radiculopathy, lumbar region: Secondary | ICD-10-CM | POA: Diagnosis not present

## 2015-06-14 DIAGNOSIS — M791 Myalgia: Secondary | ICD-10-CM

## 2015-06-14 DIAGNOSIS — M7918 Myalgia, other site: Secondary | ICD-10-CM

## 2015-06-14 NOTE — Telephone Encounter (Signed)
Patient did not show to appt today  

## 2015-06-14 NOTE — Addendum Note (Signed)
Addended by: Naomie Dean B on: 06/14/2015 09:24 AM   Modules accepted: Orders

## 2015-06-19 ENCOUNTER — Telehealth: Payer: Self-pay | Admitting: Neurology

## 2015-06-19 NOTE — Telephone Encounter (Signed)
Discussed her lumber spine MRi as below. Advised physical therapy and then at a later time can discuss facet blocks. She can continue Flexeril at night 1-2 pills. She will follow up in the office, can consider Lyrica in the future. Encouraged her to reschedule her sleep evaluation.    IMPRESSION: This is an abnormal MRI of the lumbar spine showing the following: 1. 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 Disc protrusion and moderate facet hypertrophy at L4-L5 leads to moderate bilateral foraminal narrowing. There is no definite nerve root compression though there is some encroachment upon both L4 nerve roots. 3. Disc bulging and facet hypertrophy at L5-S1 lead to mild to moderate foraminal narrowing but there does not appear to be nerve root impingement. 4. Benign appearing cyst within the lower pole of the left kidney

## 2015-07-06 ENCOUNTER — Other Ambulatory Visit: Payer: Self-pay

## 2015-07-06 NOTE — Telephone Encounter (Signed)
Error

## 2015-07-10 ENCOUNTER — Other Ambulatory Visit: Payer: Self-pay

## 2015-07-11 MED ORDER — ESOMEPRAZOLE MAGNESIUM 40 MG PO CPDR: 40.0000 mg | DELAYED_RELEASE_CAPSULE | Freq: Every day | ORAL | Status: DC

## 2015-08-22 ENCOUNTER — Ambulatory Visit: Payer: BLUE CROSS/BLUE SHIELD | Admitting: Neurology

## 2015-09-20 ENCOUNTER — Ambulatory Visit: Payer: BLUE CROSS/BLUE SHIELD | Admitting: Neurology

## 2015-10-30 ENCOUNTER — Ambulatory Visit: Payer: BLUE CROSS/BLUE SHIELD | Admitting: Neurology

## 2016-03-11 ENCOUNTER — Telehealth: Payer: Self-pay | Admitting: Neurology

## 2016-03-11 NOTE — Telephone Encounter (Signed)
Pt report mailed to home address on 03/11/2016.

## 2016-03-11 NOTE — Telephone Encounter (Signed)
Pt request MRI cervical MRI report.

## 2016-04-25 ENCOUNTER — Ambulatory Visit (INDEPENDENT_AMBULATORY_CARE_PROVIDER_SITE_OTHER): Payer: BLUE CROSS/BLUE SHIELD | Admitting: Neurology

## 2016-04-25 ENCOUNTER — Encounter: Payer: Self-pay | Admitting: Neurology

## 2016-04-25 VITALS — BP 113/75 | HR 76 | Ht 59.0 in | Wt 156.4 lb

## 2016-04-25 DIAGNOSIS — M545 Low back pain, unspecified: Secondary | ICD-10-CM

## 2016-04-25 DIAGNOSIS — M799 Soft tissue disorder, unspecified: Secondary | ICD-10-CM | POA: Diagnosis not present

## 2016-04-25 DIAGNOSIS — M542 Cervicalgia: Secondary | ICD-10-CM

## 2016-04-25 DIAGNOSIS — M158 Other polyosteoarthritis: Secondary | ICD-10-CM

## 2016-04-25 MED ORDER — CYCLOBENZAPRINE HCL 10 MG PO TABS: 10.0000 mg | ORAL_TABLET | Freq: Every evening | ORAL | 6 refills | Status: AC | PRN

## 2016-04-25 NOTE — Patient Instructions (Addendum)
Remember to drink plenty of fluid, eat healthy meals and do not skip any meals. Try to eat protein with a every meal and eat a healthy snack such as fruit or nuts in between meals. Try to keep a regular sleep-wake schedule and try to exercise daily, particularly in the form of walking, 20-30 minutes a day, if you can.   As far as your medications are concerned, I would like to suggest: Flexeril 10mg  at night  I would like to see you back as needed, sooner if we need to. Please call us with any interim questions, concerns, problems, updates or refill requests.   I also reccommend chiropractor   Our phone number is [phone]. We also have an after hours call service for urgent matters and there is a physician on-call for urgent questions. For any emergencies you know to call 911 or go to the nearest emergency room  Cyclobenzaprine tablets What is this medicine? CYCLOBENZAPRINE (sye kloe BEN za preen) is a muscle relaxer. It is used to treat muscle pain, spasms, and stiffness. This medicine may be used for other purposes; ask your health care provider or pharmacist if you have questions. What should I tell my health care provider before I take this medicine? They need to know if you have any of these conditions: -heart disease, irregular heartbeat, or previous heart attack -liver disease -thyroid problem -an unusual or allergic reaction to cyclobenzaprine, tricyclic antidepressants, lactose, other medicines, foods, dyes, or preservatives -pregnant or trying to get pregnant -breast-feeding How should I use this medicine? Take this medicine by mouth with a glass of water. Follow the directions on the prescription label. If this medicine upsets your stomach, take it with food or milk. Take your medicine at regular intervals. Do not take it more often than directed. Talk to your pediatrician regarding the use of this medicine in children. Special care may be needed. Overdosage: If you think you  have taken too much of this medicine contact a poison control center or emergency room at once. NOTE: This medicine is only for you. Do not share this medicine with others. What if I miss a dose? If you miss a dose, take it as soon as you can. If it is almost time for your next dose, take only that dose. Do not take double or extra doses. What may interact with this medicine? Do not take this medicine with any of the following medications: -certain medicines for fungal infections like fluconazole, itraconazole, ketoconazole, posaconazole, voriconazole -cisapride -dofetilide -dronedarone -droperidol -flecainide -grepafloxacin -halofantrine -levomethadyl -MAOIs like Carbex, Eldepryl, Marplan, Nardil, and Parnate -nilotinib -pimozide -probucol -sertindole -thioridazine -ziprasidone This medicine may also interact with the following medications: -abarelix -alcohol -certain medicines for cancer -certain medicines for depression, anxiety, or psychotic disturbances -certain medicines for infection like alfuzosin, chloroquine, clarithromycin, levofloxacin, mefloquine, pentamidine, troleandomycin -certain medicines for an irregular heart beat -certain medicines used for sleep or numbness during surgery or procedure -contrast dyes -dolasetron -guanethidine -methadone -octreotide -ondansetron -other medicines that prolong the QT interval (cause an abnormal heart rhythm) -palonosetron -phenothiazines like chlorpromazine, mesoridazine, prochlorperazine, thioridazine -tramadol -vardenafil This list may not describe all possible interactions. Give your health care provider a list of all the medicines, herbs, non-prescription drugs, or dietary supplements you use. Also tell them if you smoke, drink alcohol, or use illegal drugs. Some items may interact with your medicine. What should I watch for while using this medicine? Check with your doctor or health care professional if your condition  does  not improve within 1 to 3 weeks. You may get drowsy or dizzy when you first start taking the medicine or change doses. Do not drive, use machinery, or do anything that may be dangerous until you know how the medicine affects you. Stand or sit up slowly. Your mouth may get dry. Drinking water, chewing sugarless gum, or sucking on hard candy may help. What side effects may I notice from receiving this medicine? Side effects that you should report to your doctor or health care professional as soon as possible: -allergic reactions like skin rash, itching or hives, swelling of the face, lips, or tongue -chest pain -fast heartbeat -hallucinations -seizures -vomiting Side effects that usually do not require medical attention (report to your doctor or health care professional if they continue or are bothersome): -headache This list may not describe all possible side effects. Call your doctor for medical advice about side effects. You may report side effects to FDA at 1-800-FDA-1088. Where should I keep my medicine? Keep out of the reach of children. Store at room temperature between 15 and 30 degrees C (59 and 86 degrees F). Keep container tightly closed. Throw away any unused medicine after the expiration date. NOTE: This sheet is a summary. It may not cover all possible information. If you have questions about this medicine, talk to your doctor, pharmacist, or health care provider.    2016, Elsevier/Gold Standard. (2013-04-06 12:48:19)

## 2016-04-25 NOTE — Progress Notes (Signed)
[facility]    Provider:  Dr Jaynee Eagles Referring Provider: [name] Primary Care Physician:  [name], MD  Interval history 04/25/2016: Haven't seen her in a year. PT on the left arm helped. She went to see orthopaedics and shots helped with the pain. Sleeping is not better. She is under a lot of stress. BP has been better she has been getting it checked. Still waking up with pressure headache like her sinuses are full. Better with management of her sinuses. I ordered a sleep study but she never had it completed or followed up. Low back pain is better. Joint pain worse in the morning and better after walking. Had a long discussion about staying active, ways to manage arthritic back pain, recommended looking into chiropractors. Discussed medications that may help, some muscle relaxers may help but may cause sedation and usually weight loss and exercise are the best methods to help with arthritic joint pain, avoid high impact activities and heavy weight, stationary bikes and water aerobics if possible.   HPI:  Misty Stanley is a 60 y.o. female here as a referral from [name] for headache and left-sided muscular pain. She is transitioning from [name] to me for headaches. She has neck pain. She wakes up at 2am with pounding in the head, headaches. Throbbing. She also wakes up with neck pain. Husband says she snores excessively, it is so loud she vibrates the bed. Excessively tired during the day. Everything on her body hurts. Pain is muscular, tight like it needs to be worked out in the left neck and low back. No weakness but there is pain. The left leg is tight as well. She sleeps on that side. She has shooting pain, sharp and achy all the way down to the foot. The back is chronic, but no weakness just a lot of pain.   Headaches started 6-7 months ago. Left-sided muscular pain was over a year ago in onset. Headaches are daily and in the morning. The  headaches are not during the day, only nocturnally and in the morning. Left-sided pain is getting worse. Frequent awakenings and severe snoring with apneic events. Left-sided muscular pain is worse apon waking. No light sensitivity, sound sensitivity.   Reviewed notes, labs and imaging from outside physicians, which showed:   Nerve conduction studies done on both upper extremities and on the left lower extremity were unremarkable, no evidence of a neuropathy is seen. EMG evaluation on the left upper and left lower extremities were unremarkable, without evidence of an overlying cervical or lumbar radiculopathy. Personally reviewed data and images.  MRI cervical spine:personally reviewed.   FINDINGS: : On sagittal images, the spine is imaged from above the cervicomedullary junction to T2T3. Paravertebral soft tissue appears normal. The spinal cord is of normal caliber and signal. The vertebral bodies are normally aligned. The vertebral bodies have normal signal. The discs and interspaces were further evaluated on axial views from C2 to T1 as follows: C2 - C3: The disc and interspace appear normal. C3 - C4: There is mild left greater than right uncovertebral spurring with minimal disc bulging. There is mild bilateral foraminal narrowing but no nerve root compression. . C4 - C5: There is minimal disc bulging. There is no significant foraminal narrowing and no nerve root impingement. C5 - C6: There is mild bilateral uncovertebral spurring and minimal disc bulging. The neural foramina are mildly narrowed but there does not appear to be any nerve root impingement.. C6 - C7:  There is mild bilateral uncovertebral spurring and 4 disc protrusion. The neural foramina are mildly narrowed but there is no nerve root impingement.. C7 - T1: The disc and interspace appear normal.   IMPRESSION: This is an abnormal MRI of the cervical spine showing mild multilevel degenerative changes as detailed  above. There does not appear to be any nerve root impingement at any of the cervical levels.  MRi of the brain: personally reviewd images IMPRESSION: This is a normal age appropriate MRI of the head with and without contrast. There are a few punctate T2/FLAIR hyperintense foci in the subcortical white matter of the left frontal lobe likely due to minimal age-appropriate small vessel ischemic change. There are no acute findings  Unremarkable lans: ANA, esr, lyme, b12. Tsh, cbc, cmp  Previous office notes [name]: 03/30/2015:  HPI: 64 year pleasant African-American lady who's been having intermittent and progressive symptoms for the last 1 year. She states this initially began with arm pain after the death of her mother and subsequently started having intermittent transient pain involving the left, times left leg at times and posterior neck as well as headaches. The symptoms progressed over the last 6 months to become quite bothersome. She has also had some intermittent numbness in the left side. She denies any double vision, gait or balance problems. She denies any bladder or bowel urgency. She also complains of posterior neck and head pain which he describes as moderate L in nature which is now constant for the last several months. She was seen in the emergency room on 03/04/15 and was diagnosed with tension headache. CT scan of the head was obtained which are personally reviewed and was normal. She states that the pain is maximum in the back of the head but at times does radiate into her left shoulder. She also has similar pain in the left buttock which radiates down the lateral aspect of the left leg. She was seen by primary physician who started her on nortriptyline which she has taken for a couple of weeks but feels it does not help. She has tried Tylenol and ibuprofen which have not worked either. She was given Fioricet for headaches but she did not like the side effects and it did not help either.  She denies any recent falls or head injury neck injury or known degenerative spine disease. She denies any history of rash, recurrent miscarriages no family members with similar illness. She has not tried gabapentin, Topamax and Lyrica. She has not had any spine imaging studies done.    Review of Systems: Patient complains of symptoms per HPI as well as the following symptoms: Joint pain, back pain, aching muscles, walking difficulty, neck pain, neck stiffness, headache, numbness . Pertinent negatives per HPI. All others negative.    Social History   Social History  . Marital status: Married    Spouse name: [name]  . Number of children: 1  . Years of education: 70   Occupational History  . commisioner of revenue     Social History Main Topics  . Smoking status: Never Smoker  . Smokeless tobacco: Not on file  . Alcohol use No  . Drug use: No  . Sexual activity: Not on file   Other Topics Concern  . Not on file   Social History Narrative   Lives at home with husband, [name].   Drinks 1 cup of coffee a day     Family History  Problem Relation Age of Onset  .  Cancer Mother   . Breast cancer Paternal Aunt   . Heart failure Maternal Grandmother   . Diabetes Maternal Grandfather   . Multiple sclerosis Maternal Aunt   . Colon polyps Neg Hx   . Migraines Neg Hx     Past Medical History:  Diagnosis Date  . GERD (gastroesophageal reflux disease)   . Headache   . Memory loss     Past Surgical History:  Procedure Laterality Date  . ABDOMINAL HYSTERECTOMY    . ESOPHAGOGASTRODUODENOSCOPY N/A 06/23/2014   SLF: 1. Schatzki ring at the gastroesophageal junction 2. MILD non-erosive gastritis.  Marland Kitchen SAVORY DILATION N/A 06/23/2014   Procedure: SAVORY DILATION;  Surgeon: Danie Binder, MD;  Location: [facility];  Service: Endoscopy;  Laterality: N/A;    Current Outpatient Prescriptions  Medication Sig Dispense Refill  . acetaminophen (TYLENOL) 500 MG tablet Take 500 mg  by mouth every 6 (six) hours as needed.    Marland Kitchen amLODipine (NORVASC) 5 MG tablet Take 5 mg by mouth daily.    . Biotin w/ Vitamins C & E (HAIR/SKIN/NAILS PO) Take 1 tablet by mouth as needed.    . cetirizine (ZYRTEC) 10 MG tablet Take 10 mg by mouth daily.    Marland Kitchen estradiol (CLIMARA - DOSED IN MG/24 HR) 0.1 mg/24hr patch Place 1 patch onto the skin once a week.  0  . hydrochlorothiazide (HYDRODIURIL) 25 MG tablet Take 25 mg by mouth daily.    Marland Kitchen ibuprofen (ADVIL,MOTRIN) 200 MG tablet Take 600 mg by mouth daily as needed for headache (pain).    Marland Kitchen lisinopril (PRINIVIL,ZESTRIL) 10 MG tablet Take 10 mg by mouth daily.    . meclizine (ANTIVERT) 25 MG tablet Take 1 tablet (25 mg total) by mouth 3 (three) times daily as needed. 30 tablet 0  . pantoprazole (PROTONIX) 20 MG tablet Take 20 mg by mouth daily.     No current facility-administered medications for this visit.     Allergies as of 04/25/2016  . (No Known Allergies)    Vitals: BP 113/75 (BP Location: Right Arm, Patient Position: Sitting, Cuff Size: Large)   Pulse 76   Ht _0  (1.499 m)   Wt 156 lb 6.4 oz (70.9 kg)   BMI 31.59 kg/m  Last Weight:  Wt Readings from Last 1 Encounters:  04/25/16 156 lb 6.4 oz (70.9 kg)   Last Height:   Ht Readings from Last 1 Encounters:  04/25/16 _1  (1.499 m)    Physical exam: Exam: Gen: NAD, conversant, well nourised, obese, well groomed                     CV: RRR, no MRG. No Carotid Bruits. No peripheral edema, warm, nontender Eyes: Conjunctivae clear without exudates or hemorrhage  Neuro: Detailed Neurologic Exam  Speech:    Speech is normal; fluent and spontaneous with normal comprehension.  Cognition:    The patient is oriented to person, place, and time;    Cranial Nerves:    The pupils are equal, round, and reactive to light.Visual fields are full to finger confrontation. Extraocular movements are intact. Trigeminal sensation is intact and the muscles of mastication are normal.  The face is symmetric. The palate elevates in the midline. Hearing intact. Voice is normal. Shoulder shrug is normal. The tongue has normal motion without fasciculations.   Coordination:    Normal finger to nose and heel to shin. Normal rapid alternating movements.   Gait:    Heel-toe and  tandem gait are normal.   Motor Observation:    No asymmetry, no atrophy, and no involuntary movements noted. Tone:    Normal muscle tone.    Posture:    Posture is normal. normal erect    Strength:    Left biceps femoris mild weakness     Sensation: intact to LT     Reflex Exam:  DTR's:    Deep tendon reflexes in the upper and lower extremities are normal bilaterally.   Toes:    The toes are downgoing bilaterally.   Clonus:    Clonus is absent.      Assessment/Plan:  60 year old female with left-sided neck, arm and leg pain and morning headaches with excessive snoring and apneic events, recommended sleep test but she never had it completed. She has back pain and arthritic pain.   Water aerobics and massage, weight loss and diet  Recommend chiropractor Flexeril 93m before bed for muscular pain Sleep evaluation for OSA and hypoventilation syndrome. She declined despite discussion on sequelae of untreated OSA such as stroke, HTN, pulm HTN, obesity and others. Discussed flexeril side effects as per patient instructions   ASarina Ill MD  [facility] [address]va[place]iber[place] [address] Phone [phone] [phone] Ill MD  [facility] [address]SRiversideGMoberly Ravin[place] [address] Phone [phone] [phone] A total of 30 minutes was spent face-to-face with this patient. Over half this time was spent on counseling patient on the headache, obesity, low back pain and joint pain diagnosis and different diagnostic and therapeutic options available.

## 2016-04-28 ENCOUNTER — Encounter: Payer: Self-pay | Admitting: Neurology

## 2016-07-13 IMAGING — CT CT HEAD W/O CM
1 series · 16 of 30 positions shown, 20 images · non-contrast
Comparison: 07/19/2011,

CLINICAL DATA: Frontal headaches for 2 weeks with left-sided pain
and numbness.

EXAM:
CT HEAD WITHOUT CONTRAST
TECHNIQUE: Contiguous axial images were obtained from the base of the skull
through the vertex without intravenous contrast.

[Series 2: head 5.0 h30s · axial · 0.45mm/px · z∈[-121,+14]mm · 16 of 30 slices shown, 20 images]
[im 2/30  brain]
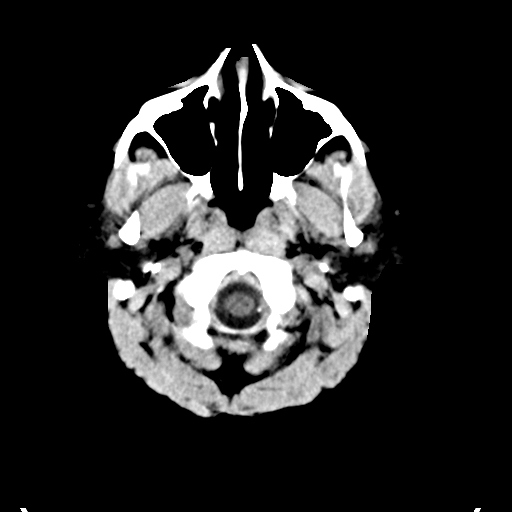
[im 2/30  bone]
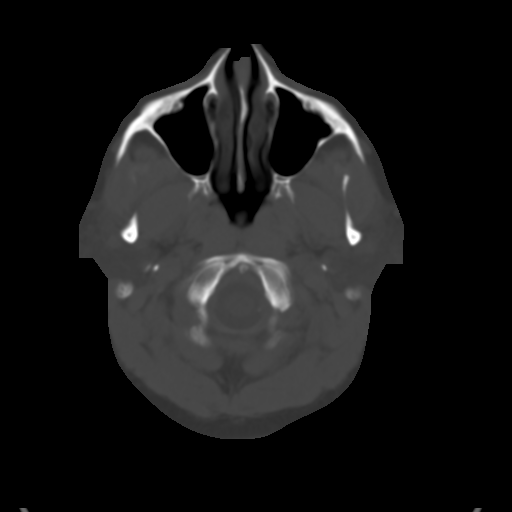
[im 4/30  brain]
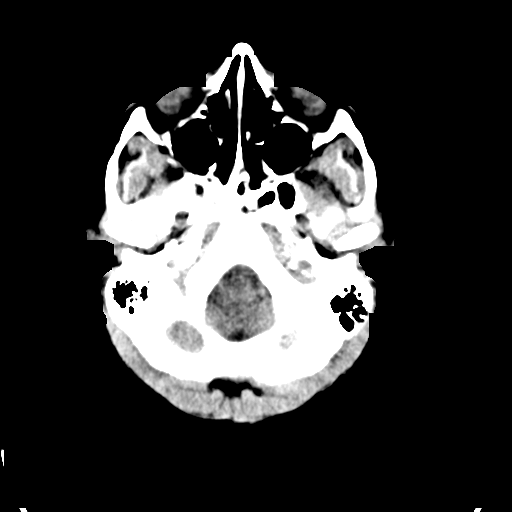
[im 6/30  brain]
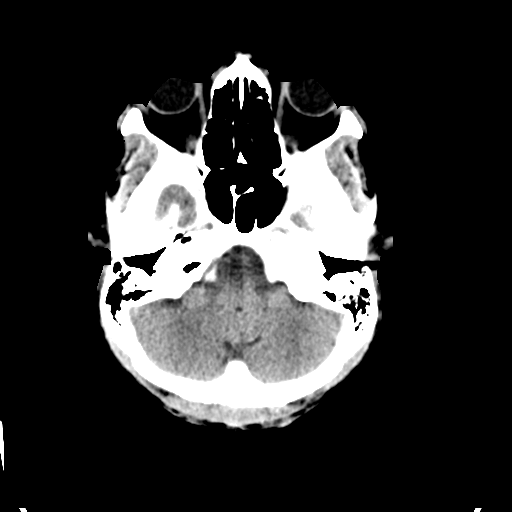
[im 8/30  brain]
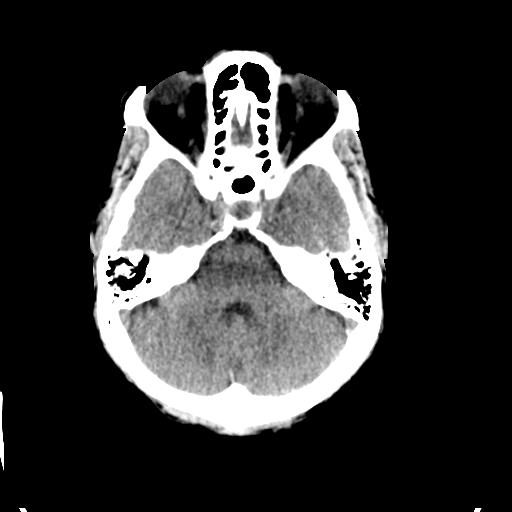
[im 9/30  brain]
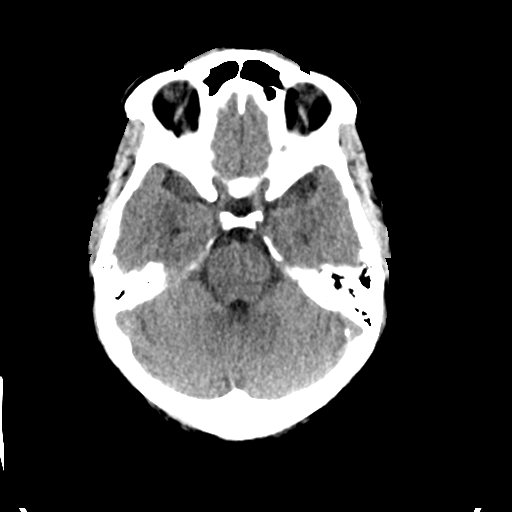
[im 9/30  bone]
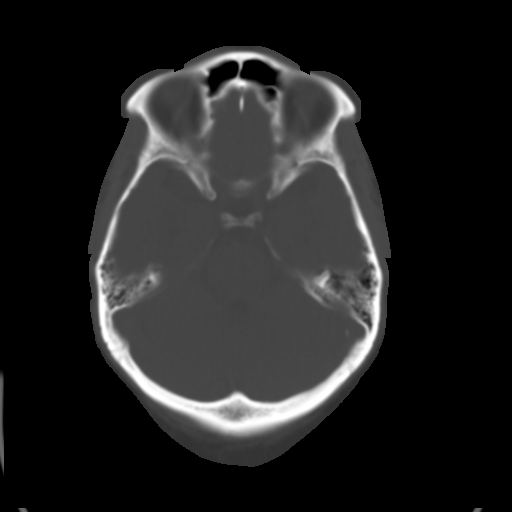
[im 11/30  brain]
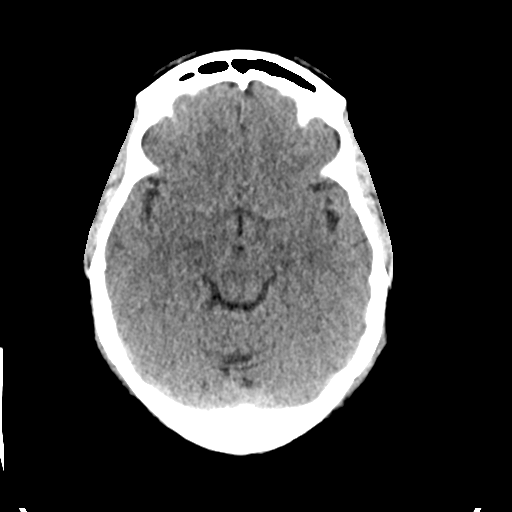
[im 13/30  brain]
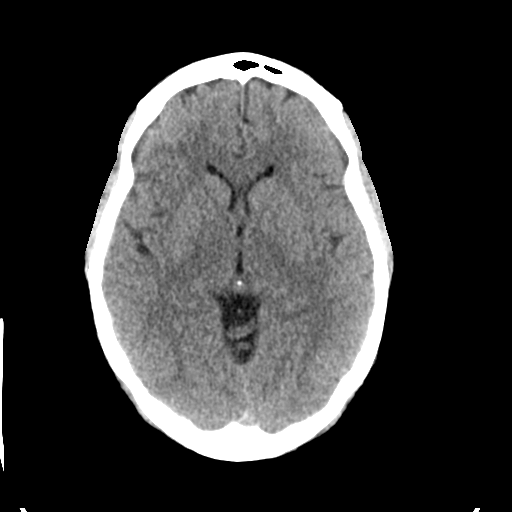
[im 15/30  brain]
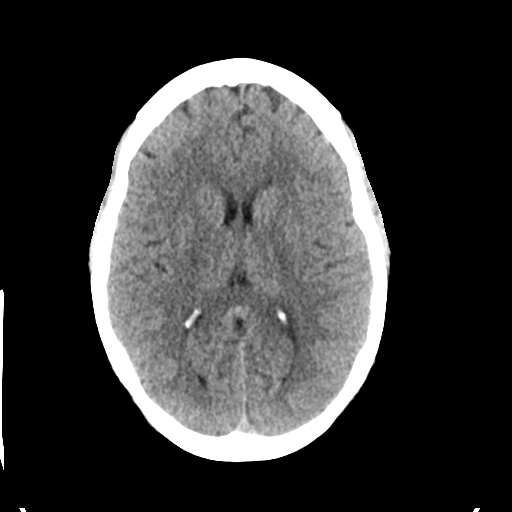
[im 16/30  brain]
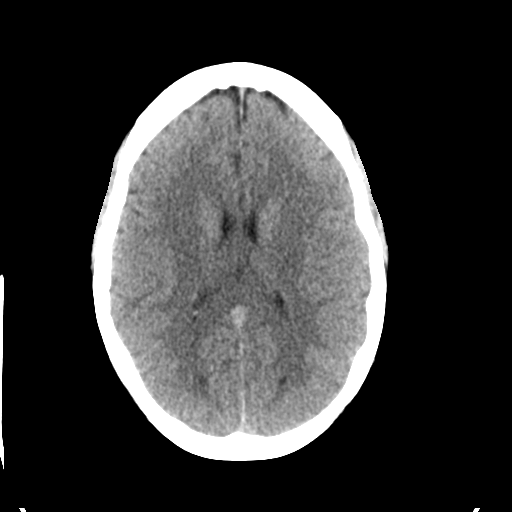
[im 16/30  bone]
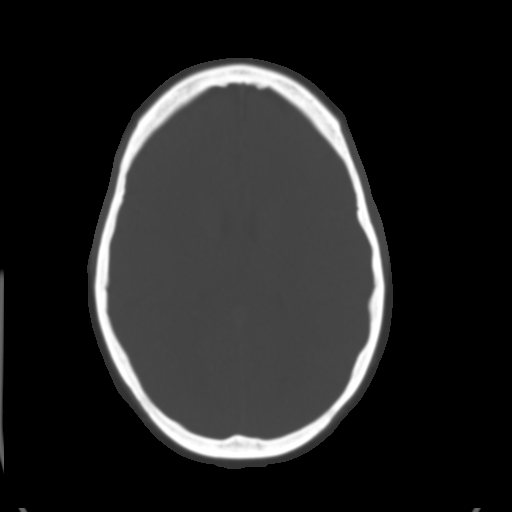
[im 18/30  brain]
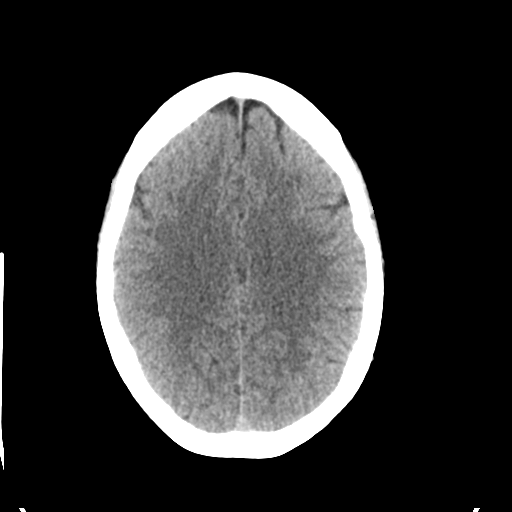
[im 20/30  brain]
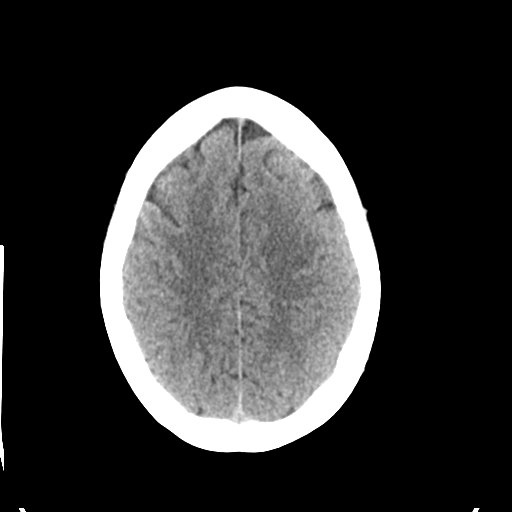
[im 22/30  brain]
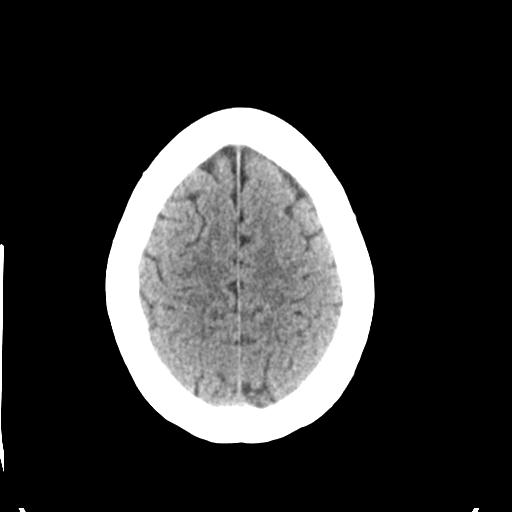
[im 23/30  brain]
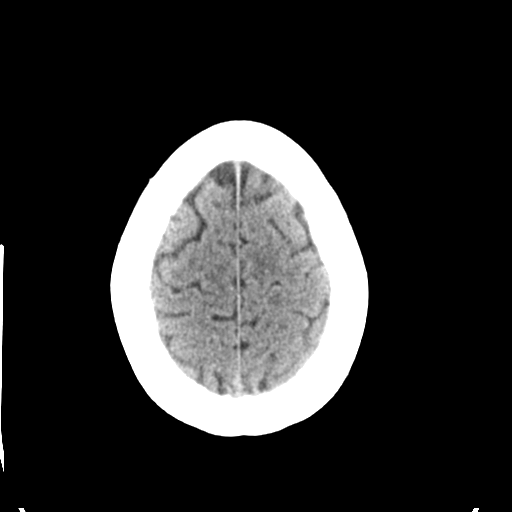
[im 23/30  bone]
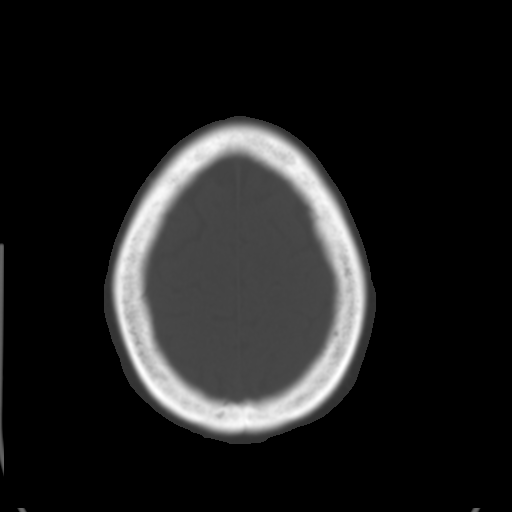
[im 25/30  brain]
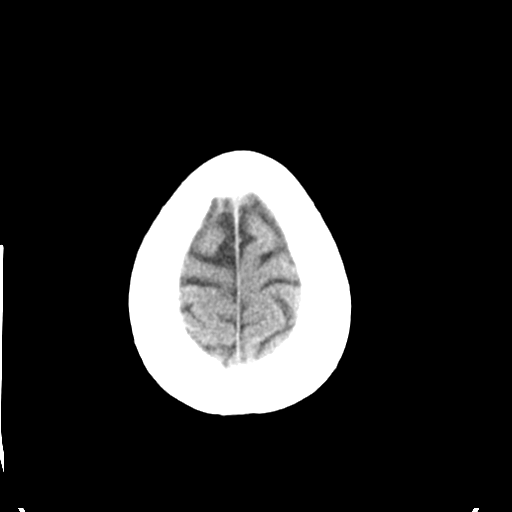
[im 27/30  brain]
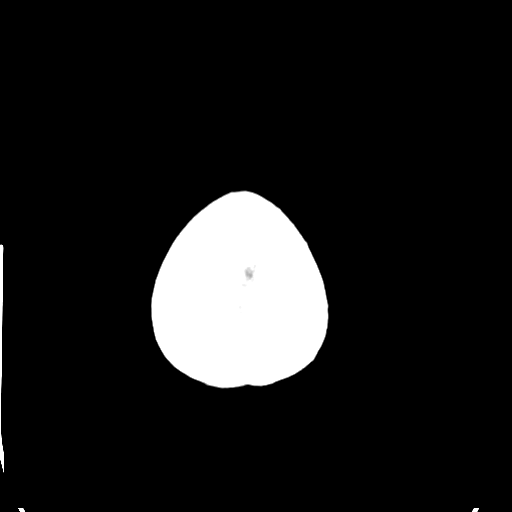
[im 29/30  brain]
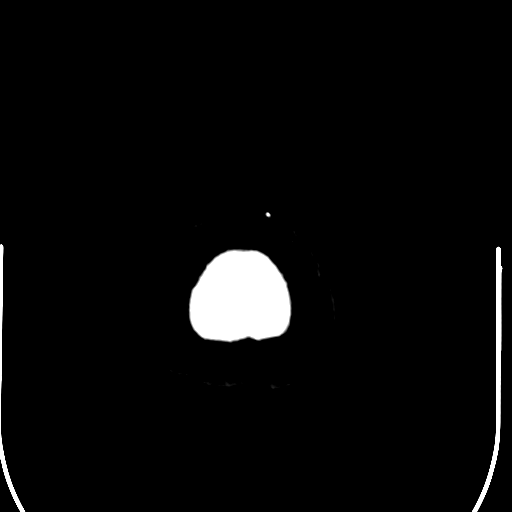

[16 of 30 positions shown; findings below may reference images not displayed]

FINDINGS: Sinuses/Soft tissues: Clear paranasal sinuses and mastoid air cells.

Intracranial: No mass lesion, hemorrhage, hydrocephalus, acute
infarct, intra-axial, or extra-axial fluid collection.
IMPRESSION: Normal head CT.

## 2016-10-01 ENCOUNTER — Other Ambulatory Visit: Payer: Self-pay

## 2016-10-02 MED ORDER — ESOMEPRAZOLE MAGNESIUM 40 MG PO CPDR: 40.0000 mg | DELAYED_RELEASE_CAPSULE | Freq: Every day | ORAL | 3 refills | Status: AC

## 2016-10-23 IMAGING — MR MR LUMBAR SPINE W/ CM
4 of 5 series · 29 of 48 positions shown · non-contrast
Comparison: none

[Series 2: T2 · sagittal · 4.0mm · 1.09mm/px · 6 of 14 slices shown (1 of 2)]
[im 1/14]
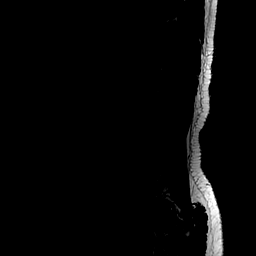
[im 3/14]
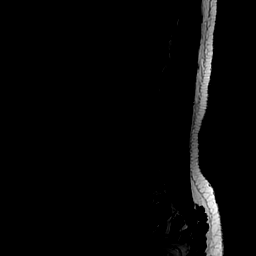
[im 6/14]
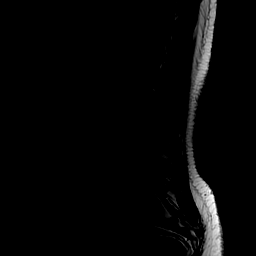
[im 8/14]
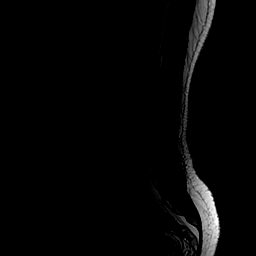
[im 11/14]
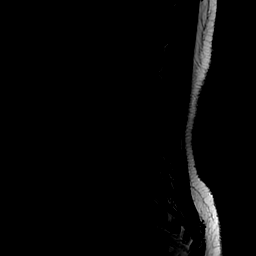
[im 14/14]
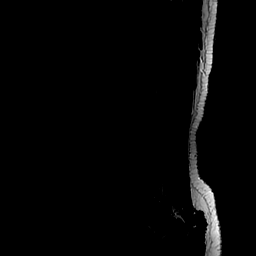

[Series 3: T1 · sagittal · 4.0mm · 1.09mm/px · 7 of 14 slices shown (1 of 2)]
[im 1/14]
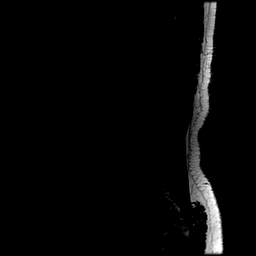
[im 3/14]
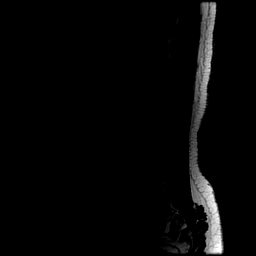
[im 5/14]
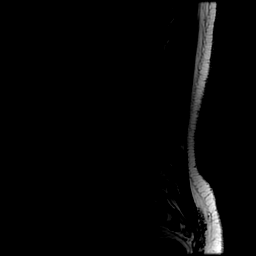
[im 7/14]
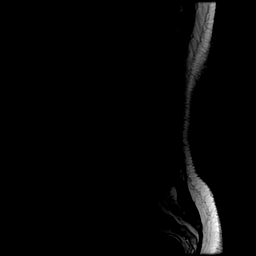
[im 9/14]
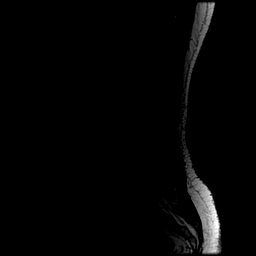
[im 11/14]
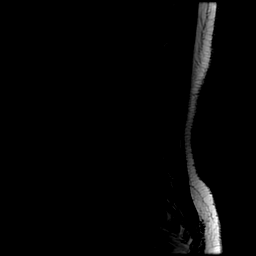
[im 14/14]
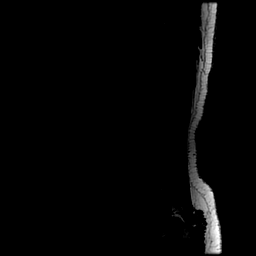

[Series 5: T2 · axial · 4.0mm · 0.47mm/px · z∈[-126,+88]mm · 9 of 25 slices shown (2 of 2)]
[im 1/25]
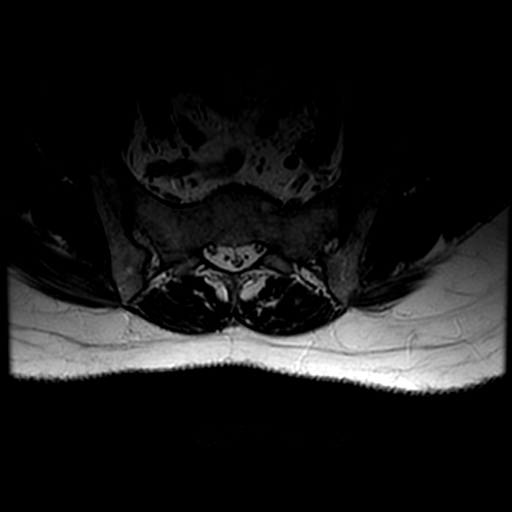
[im 5/25]
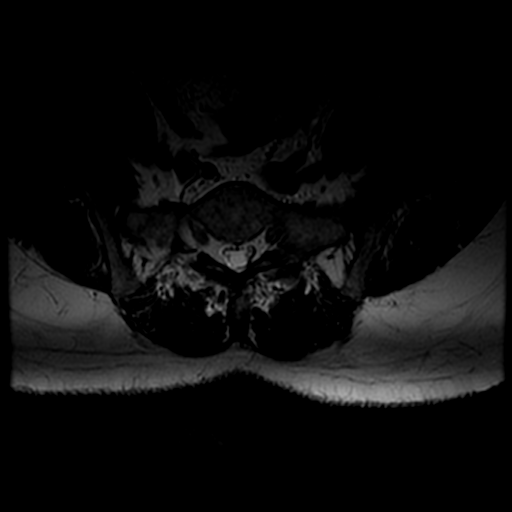
[im 7/25]
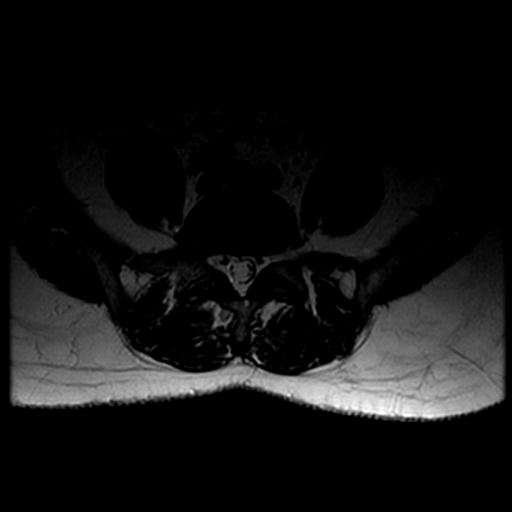
[im 11/25]
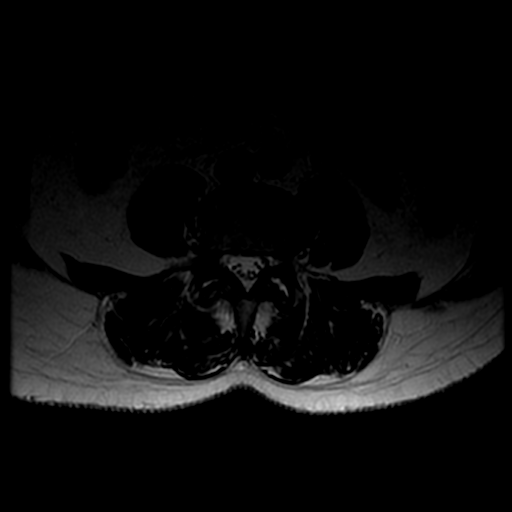
[im 14/25]
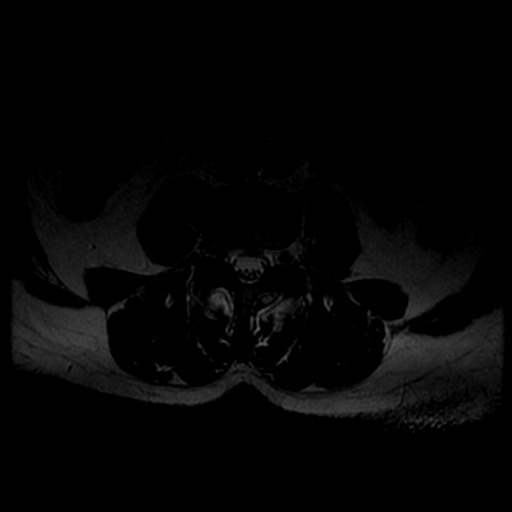
[im 18/25]
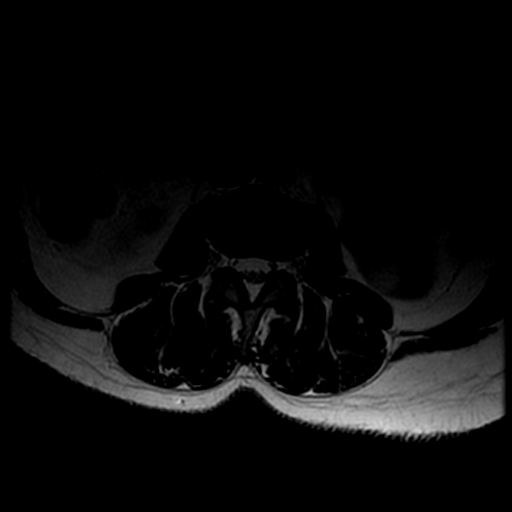
[im 20/25]
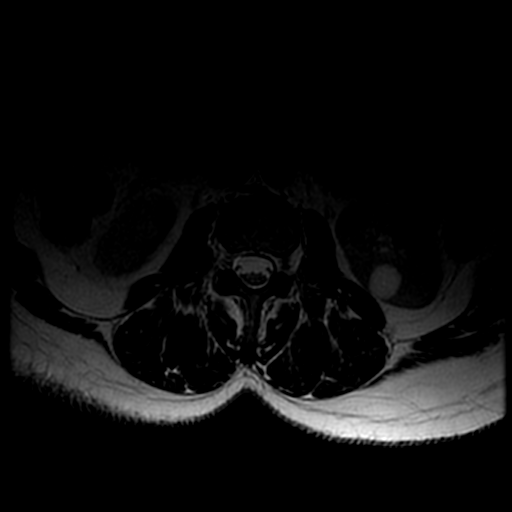
[im 22/25]
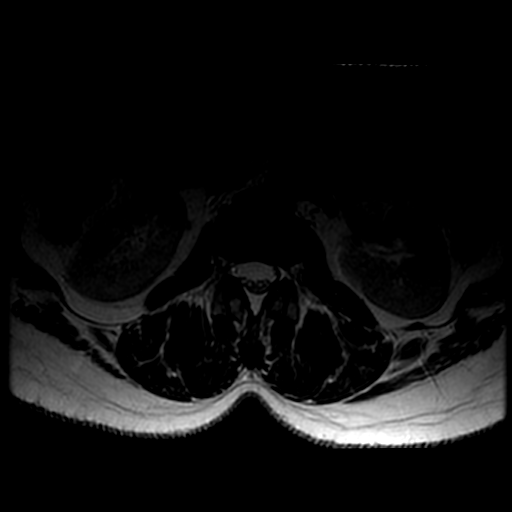
[im 25/25]
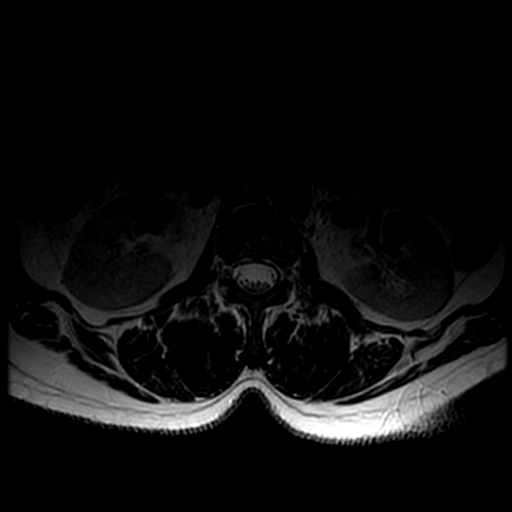

[Series 6: T1 · axial · 4.0mm · 0.47mm/px · z∈[-68,+51]mm · 7 of 35 slices shown (2 of 2)]
[im 3/35]
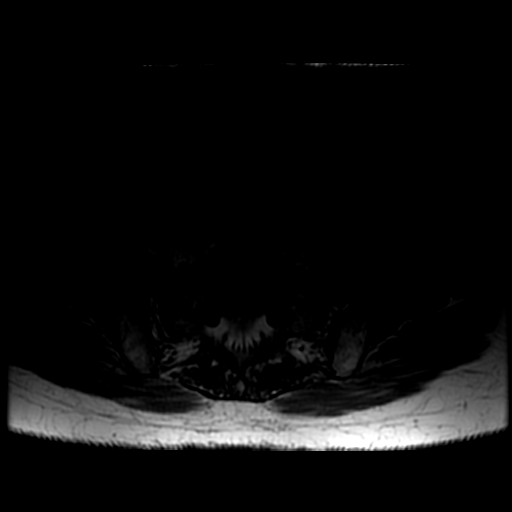
[im 5/35]
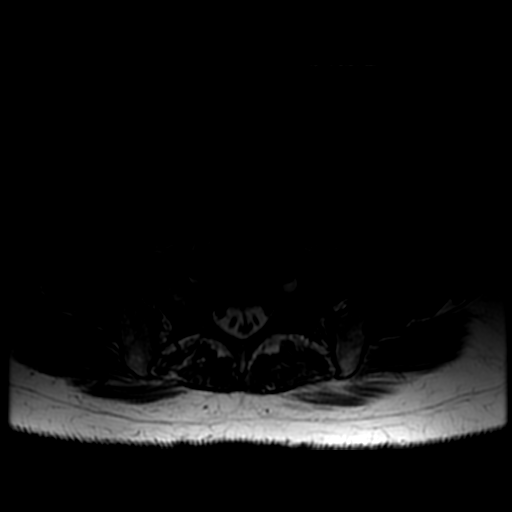
[im 7/35]
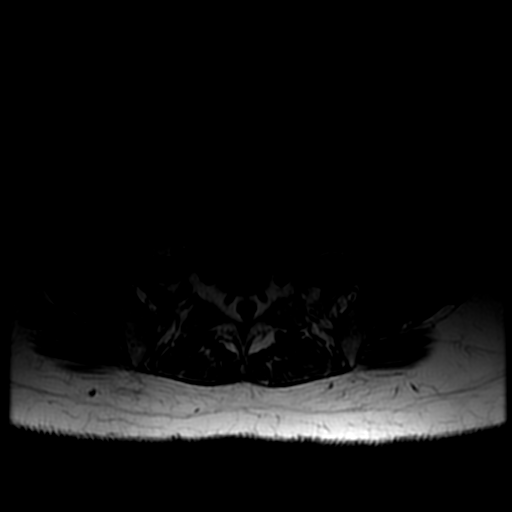
[im 12/35]
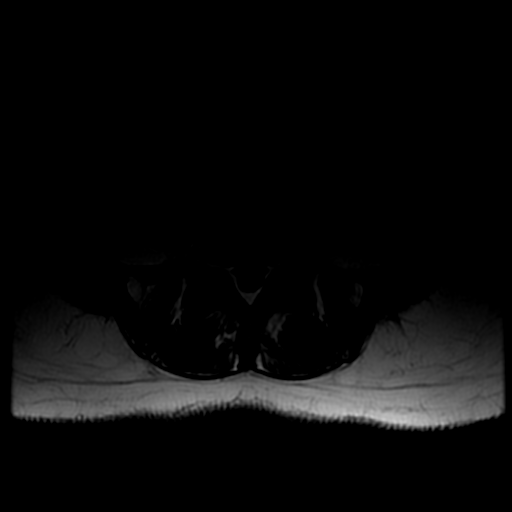
[im 16/35]
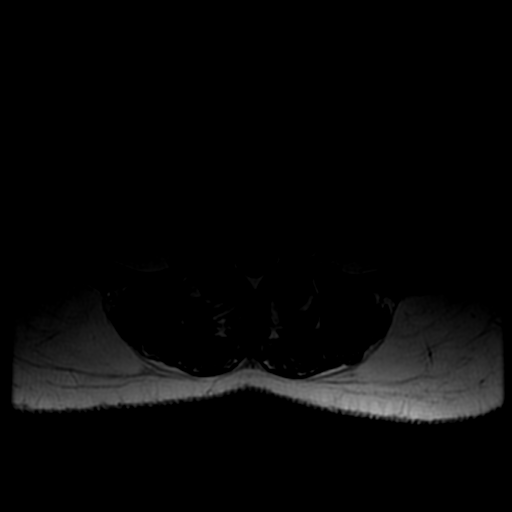
[im 19/35]
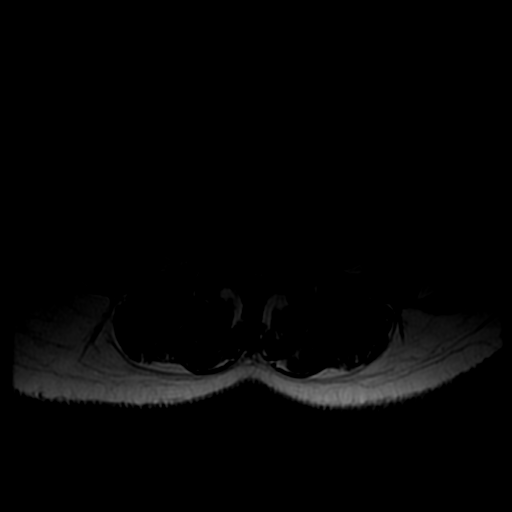
[im 30/35]
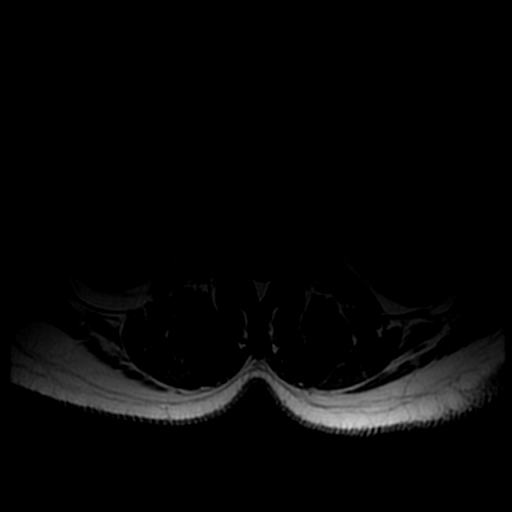

[29 of 48 positions shown; findings below may reference images not displayed]

Canned report from images found in remote index.

Refer to host system for actual result text.

## 2018-02-06 ENCOUNTER — Other Ambulatory Visit: Payer: Self-pay | Admitting: Family Medicine

## 2018-02-06 DIAGNOSIS — Z1231 Encounter for screening mammogram for malignant neoplasm of breast: Secondary | ICD-10-CM

## 2018-02-27 ENCOUNTER — Ambulatory Visit: Payer: BLUE CROSS/BLUE SHIELD
